# Patient Record
Sex: Female | Born: 1960 | Race: Black or African American | Hispanic: No | Smoking: Former smoker
Health system: Southern US, Community
[De-identification: ages and names within clinical notes are randomized; demographics above are authoritative.]

## PROBLEM LIST (undated history)

## (undated) DIAGNOSIS — I10 Essential (primary) hypertension: Secondary | ICD-10-CM

## (undated) DIAGNOSIS — M792 Neuralgia and neuritis, unspecified: Secondary | ICD-10-CM

## (undated) DIAGNOSIS — E119 Type 2 diabetes mellitus without complications: Secondary | ICD-10-CM

## (undated) DIAGNOSIS — F419 Anxiety disorder, unspecified: Secondary | ICD-10-CM

## (undated) DIAGNOSIS — M1990 Unspecified osteoarthritis, unspecified site: Secondary | ICD-10-CM

## (undated) DIAGNOSIS — E7800 Pure hypercholesterolemia, unspecified: Secondary | ICD-10-CM

## (undated) DIAGNOSIS — E079 Disorder of thyroid, unspecified: Secondary | ICD-10-CM

## (undated) DIAGNOSIS — G4730 Sleep apnea, unspecified: Secondary | ICD-10-CM

## (undated) DIAGNOSIS — G8929 Other chronic pain: Secondary | ICD-10-CM

## (undated) DIAGNOSIS — K219 Gastro-esophageal reflux disease without esophagitis: Secondary | ICD-10-CM

## (undated) DIAGNOSIS — M25569 Pain in unspecified knee: Secondary | ICD-10-CM

## (undated) HISTORY — PX: CHOLECYSTECTOMY: SHX55

## (undated) HISTORY — PX: JOINT REPLACEMENT: SHX530

## (undated) HISTORY — DX: Anxiety disorder, unspecified: F41.9

## (undated) HISTORY — PX: BREAST SURGERY: SHX581

## (undated) HISTORY — PX: ABDOMINAL HYSTERECTOMY: SHX81

## (undated) HISTORY — PX: CARPAL TUNNEL RELEASE: SHX101

---

## 2007-03-13 ENCOUNTER — Encounter: Payer: Self-pay | Admitting: Pulmonary Disease

## 2007-04-09 ENCOUNTER — Encounter: Payer: Self-pay | Admitting: Pulmonary Disease

## 2010-06-23 DIAGNOSIS — M129 Arthropathy, unspecified: Secondary | ICD-10-CM | POA: Insufficient documentation

## 2010-06-23 DIAGNOSIS — E785 Hyperlipidemia, unspecified: Secondary | ICD-10-CM | POA: Insufficient documentation

## 2010-06-23 DIAGNOSIS — I10 Essential (primary) hypertension: Secondary | ICD-10-CM | POA: Insufficient documentation

## 2010-07-10 ENCOUNTER — Ambulatory Visit: Payer: Self-pay | Admitting: Pulmonary Disease

## 2010-07-10 DIAGNOSIS — G4733 Obstructive sleep apnea (adult) (pediatric): Secondary | ICD-10-CM | POA: Insufficient documentation

## 2010-07-10 DIAGNOSIS — G2581 Restless legs syndrome: Secondary | ICD-10-CM | POA: Insufficient documentation

## 2010-08-18 ENCOUNTER — Encounter: Payer: Self-pay | Admitting: Pulmonary Disease

## 2010-12-22 NOTE — Medication Information (Signed)
Summary: [facility]   Imported By: Lester Houghton 08/26/2010 07:53:42  _____________________________________________________________________  External Attachment:    Type:   Image     Comment:   External Document

## 2010-12-22 NOTE — Assessment & Plan Note (Signed)
Summary: consult for osa, RLS   Copy to:  Amada Kingfisher Bonsu Primary Provider/Referring Provider:  Amada Kingfisher Bonsu  CC:  Sleep Consult.  History of Present Illness: The pt is a 49y/o female who is referred for management of osa.  She was diagnosed in 2008 with mild osa (AHI 11/hr), and large numbers of leg jerks with arousal index of 4/hr.  She was started on cpap, but discontinued its use because she felt it was not helping her.  She also had issues with having something on her face during the night.  The pt was also started on  carbidopa/levadopa for her her PLMS.  She currently does have some breakthru RLS symptoms, but it is much worse if she doesn't take her medication.  The pt has been noted to have snoring, but no one has noted pauses in her breathing during sleep.  She typically goes to bed around 11pm, and requires a sleep aid to initiate sleep.  She awakens at 5am to start her day, and is somewhat tired but "feels ok".  She denies sleepiness or fogginess during the day with periods of inactivity, and her epworth score today is very normal at zero.  She does feel however, that her overall alertness could be better.  She denies any sleepiness in the evening watching tv or movies, and has no sleepiness issues with driving.  She tells me that her weight is up 15 pounds from 2007.    Current Medications (verified): 1)  Zolpidem Tartrate 10 Mg Tabs (Zolpidem Tartrate) .... At Bedtime 2)  Phentermine Hcl 37.5 Mg Tabs (Phentermine Hcl) .... Take 1 Tablet By Mouth Two Times A Day 3)  Hyzaar 100-25 Mg Tabs (Losartan Potassium-Hctz) .... Take 1 Tablet By Mouth Once A Day 4)  Mobic 15 Mg Tabs (Meloxicam) .... Take 1 Tablet By Mouth Once A Day 5)  Carbidopa-Levodopa 25-100 Mg Tabs (Carbidopa-Levodopa) .... Take 2 Tabs By Mouth At Bedtime 6)  Simvastatin 80 Mg Tabs (Simvastatin) .... Take 1 Tablet By Mouth Once A Day 7)  Klor-Con 10 10 Meq Cr-Tabs (Potassium Chloride) .... Take 1 Tablet By Mouth Once  A Day 8)  Zinc 50 Mg Tabs (Zinc) .... Take 1 Tablet By Mouth Once A Day 9)  Magnesium 250 Mg Tabs (Magnesium) .... Take 1 Tablet By Mouth Once A Day 10)  Soy Isoflavones 40 Mg Tabs (Soy Isoflavone) .... Take 2 Tabs By Mouth Daily 11)  Vitamin D3 2000 Unit Caps (Cholecalciferol) .... Take 1 Tablet By Mouth Once A Day 12)  Biotin 5000 5 Mg Caps (Biotin) .... Take 1 Tablet By Mouth Once A Day 13)  Fish Oil 1000 Mg Caps (Omega-3 Fatty Acids) .... Take 2 Tabs By Mouth Daily 14)  Vitamin B-12 250 Mcg Tabs (Cyanocobalamin) .... Take 1 Tablet By Mouth Once A Day 15)  Stool Softener 100 Mg Caps (Docusate Sodium) .... Take 3 Tabs By Mouth Daily 16)  Sam-E 400 Mg Tabs (S-Adenosylmethionine) .... Take 1 Tablet By Mouth Once A Day 17)  Ra Suphedrine 120 Mg Xr12h-Tab (Pseudoephedrine Hcl) .... As Needed  Allergies (verified): No Known Drug Allergies  Past History:  Past Medical History: RLS ARTHRITIS (ICD-716.90) OBSTRUCTIVE SLEEP APNEA (ICD-327.23)--AHI 11/hr 2008 HYPERTENSION (ICD-401.9) HYPERLIPIDEMIA (ICD-272.4)    Past Surgical History: hysterectomy Nov 2001 Tubal Ligation Sept 1988 Cholecystectomy 2002 bilateral carpal tunnel April 2008  Family History: Reviewed history from 06/23/2010 and no changes required. father-epilepsy mother-decease, HTN  Social History: Reviewed history from 06/23/2010 and no changes  required. Patient is a current smoker.   started at age 72.  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  The patient denies shortness of breath with activity, shortness of breath at rest, productive cough, non-productive cough, coughing up blood, chest pain, irregular heartbeats, indigestion, loss of appetite,  weight change, difficulty swallowing, sore throat, tooth/dental problems, sneezing, itching, ear ache, anxiety, depression, hand/feet swelling, rash, change in color of mucus, and fever.    Vital Signs:  Patient profile:   50 year old female Height:      63 inches Weight:      177.50 pounds BMI:     31.56 O2 Sat:      97 % on Room air Temp:     98.5 degrees F oral Pulse rate:   87 / minute BP sitting:   114 / 78  (right arm) Cuff size:   regular  Vitals Entered By: Arman Filter LPN (July 10, 2010 2:15 PM)  O2 Flow:  Room air CC: Sleep Consult Comments Medications reviewed with patient Arman Filter LPN  July 10, 2010 2:21 PM    Physical Exam  General:  ow female in nad Eyes:  PERRLA and EOMI.   Nose:  turbinate hypertrophy, mild narrowing bilat Mouth:  fairly normal uvula and palate. Neck:  no jvd ,tmg, LN Lungs:  clear to auscultation Heart:  rrr, no mrg. Abdomen:  soft and nontender, bs+ Extremities:  no significant edema, pulses intact distally no cyanosis  Neurologic:  alert and oriented, moves all 4.    Impression & Recommendations:  Problem # 1:  OBSTRUCTIVE SLEEP APNEA (ICD-327.23)  the pt has a h/o very mild osa, and currently is not on treatment.  She is not excessively sleepy (epworth score 0, and denies sleepiness with inactivity), and this degree of sleep apnea is not a health risk for her.  I think it is best at this time to work on weight loss and positional therapy, and to stay off cpap for now.  She had a lot of issues with cpap, and it worsened her sleep onset issues.  If she feels that sleepiness is impacting her QOL adversely, we can always re-initiate cpap vs looking at other treatment options.  Problem # 2:  RESTLESS LEGS SYNDROME (ICD-333.94)  the pt is on a very old medication for RLS that is usually not well tolerated due to nausea.  The pt is having some issues with this.  I would like to try her on requip to see if this helps with nausea,  and if her leg jerks are better controlled.  Medications Added to Medication List This Visit: 1)  Phentermine Hcl 37.5 Mg Tabs (Phentermine hcl) .... Take 1 tablet by mouth two times a day 2)  Hyzaar 100-25 Mg Tabs (Losartan potassium-hctz) .... Take 1 tablet by mouth once a day 3)  Carbidopa-levodopa 25-100 Mg Tabs (Carbidopa-levodopa) .... Take 2 tabs by mouth at bedtime 4)  Simvastatin 80 Mg Tabs (Simvastatin) .... Take 1 tablet by mouth once a day 5)  Klor-con 10 10 Meq Cr-tabs (Potassium chloride) .... Take 1 tablet by mouth once a day 6)  Zinc 50 Mg Tabs (Zinc) .... Take 1 tablet by mouth once a day 7)  Magnesium 250 Mg Tabs (Magnesium) .... Take 1 tablet by mouth once a day 8)  Soy Isoflavones 40 Mg Tabs (Soy isoflavone) .... Take 2 tabs by mouth daily 9)  Vitamin D3 2000 Unit Caps (Cholecalciferol) .... Take 1 tablet by mouth once a day 10)  Biotin 5000 5 Mg Caps (Biotin) .... Take 1 tablet by mouth once a day 11)  Fish Oil 1000 Mg Caps (Omega-3 fatty acids) .... Take 2 tabs by mouth daily 12)  Vitamin B-12 250 Mcg Tabs (Cyanocobalamin) .... Take 1 tablet by mouth once a day 13)  Stool Softener 100 Mg Caps (Docusate sodium) .... Take 3 tabs by mouth daily 14)  Sam-e 400 Mg Tabs (S-adenosylmethionine) .... Take 1 tablet by mouth once a day 15)  Ra Suphedrine 120 Mg Xr12h-tab (Pseudoephedrine hcl) .... As needed 16)  Requip 0.5 Mg Tabs (Ropinirole hcl) .... One after dinner each night.  can increase to 2 if having breakthru symptoms.  Other Orders: Consultation Level IV (41324)  Patient Instructions: 1)  change your carbidopa to requip 0.5mg  one after dinner on a full stomach.  Can increase to 2 after dinner if leg movements not completely controlled. 2)  will hold off on starting cpap for now.  Work on weight loss and avoid sleeping on your back 3)  followup with me in 6mos to check on your progress.  Prescriptions: REQUIP 0.5 MG  TABS (ROPINIROLE HCL) one after dinner each night.   Can increase to 2 if having breakthru symptoms.  #60 x 5   Entered and Authorized by:   Barbaraann Share MD   Signed by:   Barbaraann Share MD on 07/10/2010   Method used:   Print then Give to Patient   RxID:   [id number]

## 2011-01-06 ENCOUNTER — Ambulatory Visit: Payer: Self-pay | Admitting: Pulmonary Disease

## 2011-01-07 ENCOUNTER — Telehealth: Payer: Self-pay | Admitting: Pulmonary Disease

## 2011-01-13 NOTE — Progress Notes (Signed)
Summary: nos appt  Phone Note Call from Patient   Caller: [email or web address]  Call For: Trista Ciocca Summary of Call: ATC pt to rsc nos from 2/15 no vm. Initial call taken by: Darletta Moll,  January 07, 2011 10:49 AM

## 2012-06-16 DIAGNOSIS — R590 Localized enlarged lymph nodes: Secondary | ICD-10-CM | POA: Insufficient documentation

## 2012-06-25 DIAGNOSIS — R918 Other nonspecific abnormal finding of lung field: Secondary | ICD-10-CM | POA: Insufficient documentation

## 2012-12-20 DIAGNOSIS — D869 Sarcoidosis, unspecified: Secondary | ICD-10-CM | POA: Insufficient documentation

## 2014-05-28 DIAGNOSIS — K219 Gastro-esophageal reflux disease without esophagitis: Secondary | ICD-10-CM | POA: Insufficient documentation

## 2014-05-28 DIAGNOSIS — Z0000 Encounter for general adult medical examination without abnormal findings: Secondary | ICD-10-CM | POA: Insufficient documentation

## 2014-05-28 DIAGNOSIS — E559 Vitamin D deficiency, unspecified: Secondary | ICD-10-CM | POA: Insufficient documentation

## 2014-05-28 DIAGNOSIS — G4700 Insomnia, unspecified: Secondary | ICD-10-CM | POA: Insufficient documentation

## 2014-05-28 DIAGNOSIS — E039 Hypothyroidism, unspecified: Secondary | ICD-10-CM | POA: Insufficient documentation

## 2014-10-08 DIAGNOSIS — N760 Acute vaginitis: Secondary | ICD-10-CM | POA: Insufficient documentation

## 2014-12-24 DIAGNOSIS — M1712 Unilateral primary osteoarthritis, left knee: Secondary | ICD-10-CM | POA: Insufficient documentation

## 2015-01-23 DIAGNOSIS — R2233 Localized swelling, mass and lump, upper limb, bilateral: Secondary | ICD-10-CM | POA: Insufficient documentation

## 2015-02-04 DIAGNOSIS — N6019 Diffuse cystic mastopathy of unspecified breast: Secondary | ICD-10-CM | POA: Insufficient documentation

## 2015-02-04 DIAGNOSIS — J302 Other seasonal allergic rhinitis: Secondary | ICD-10-CM | POA: Insufficient documentation

## 2015-02-04 DIAGNOSIS — N644 Mastodynia: Secondary | ICD-10-CM | POA: Insufficient documentation

## 2015-02-04 DIAGNOSIS — R0602 Shortness of breath: Secondary | ICD-10-CM | POA: Insufficient documentation

## 2015-04-30 DIAGNOSIS — Z780 Asymptomatic menopausal state: Secondary | ICD-10-CM | POA: Insufficient documentation

## 2015-07-23 LAB — HM PAP SMEAR: HM Pap smear: NEGATIVE

## 2015-07-30 DIAGNOSIS — M19041 Primary osteoarthritis, right hand: Secondary | ICD-10-CM | POA: Insufficient documentation

## 2015-08-20 DIAGNOSIS — R0789 Other chest pain: Secondary | ICD-10-CM | POA: Insufficient documentation

## 2015-11-23 HISTORY — PX: CARDIOVASCULAR STRESS TEST: SHX262

## 2016-01-14 DIAGNOSIS — M170 Bilateral primary osteoarthritis of knee: Secondary | ICD-10-CM | POA: Insufficient documentation

## 2016-03-15 DIAGNOSIS — M792 Neuralgia and neuritis, unspecified: Secondary | ICD-10-CM | POA: Insufficient documentation

## 2016-03-19 DIAGNOSIS — M5416 Radiculopathy, lumbar region: Secondary | ICD-10-CM | POA: Insufficient documentation

## 2016-04-01 DIAGNOSIS — G5730 Lesion of lateral popliteal nerve, unspecified lower limb: Secondary | ICD-10-CM | POA: Insufficient documentation

## 2016-06-29 DIAGNOSIS — R600 Localized edema: Secondary | ICD-10-CM | POA: Insufficient documentation

## 2016-06-29 DIAGNOSIS — Z860100 Personal history of colon polyps, unspecified: Secondary | ICD-10-CM | POA: Insufficient documentation

## 2016-06-29 LAB — HM HEPATITIS C SCREENING LAB: HM Hepatitis Screen: NEGATIVE

## 2016-08-24 DIAGNOSIS — M722 Plantar fascial fibromatosis: Secondary | ICD-10-CM | POA: Insufficient documentation

## 2016-12-13 DIAGNOSIS — R5383 Other fatigue: Secondary | ICD-10-CM | POA: Insufficient documentation

## 2017-03-23 ENCOUNTER — Emergency Department (HOSPITAL_BASED_OUTPATIENT_CLINIC_OR_DEPARTMENT_OTHER): Payer: BLUE CROSS/BLUE SHIELD

## 2017-03-23 ENCOUNTER — Encounter (HOSPITAL_BASED_OUTPATIENT_CLINIC_OR_DEPARTMENT_OTHER): Payer: Self-pay | Admitting: Emergency Medicine

## 2017-03-23 ENCOUNTER — Emergency Department (HOSPITAL_BASED_OUTPATIENT_CLINIC_OR_DEPARTMENT_OTHER)
Admission: EM | Admit: 2017-03-23 | Discharge: 2017-03-23 | Disposition: A | Discharge status: Home / Self Care | Payer: BLUE CROSS/BLUE SHIELD | Attending: Emergency Medicine | Admitting: Emergency Medicine

## 2017-03-23 DIAGNOSIS — Z87891 Personal history of nicotine dependence: Secondary | ICD-10-CM | POA: Diagnosis not present

## 2017-03-23 DIAGNOSIS — R110 Nausea: Secondary | ICD-10-CM | POA: Diagnosis not present

## 2017-03-23 DIAGNOSIS — I10 Essential (primary) hypertension: Secondary | ICD-10-CM | POA: Diagnosis not present

## 2017-03-23 DIAGNOSIS — Z79899 Other long term (current) drug therapy: Secondary | ICD-10-CM | POA: Insufficient documentation

## 2017-03-23 DIAGNOSIS — R072 Precordial pain: Secondary | ICD-10-CM | POA: Insufficient documentation

## 2017-03-23 DIAGNOSIS — R0602 Shortness of breath: Secondary | ICD-10-CM | POA: Diagnosis not present

## 2017-03-23 HISTORY — DX: Pure hypercholesterolemia, unspecified: E78.00

## 2017-03-23 HISTORY — DX: Pain in unspecified knee: M25.569

## 2017-03-23 HISTORY — DX: Essential (primary) hypertension: I10

## 2017-03-23 HISTORY — DX: Neuralgia and neuritis, unspecified: M79.2

## 2017-03-23 HISTORY — DX: Disorder of thyroid, unspecified: E07.9

## 2017-03-23 HISTORY — DX: Gastro-esophageal reflux disease without esophagitis: K21.9

## 2017-03-23 HISTORY — DX: Other chronic pain: G89.29

## 2017-03-23 LAB — URINALYSIS, ROUTINE W REFLEX MICROSCOPIC
Bilirubin Urine: NEGATIVE
Glucose, UA: NEGATIVE mg/dL
Hgb urine dipstick: NEGATIVE
Ketones, ur: NEGATIVE mg/dL
Leukocytes, UA: NEGATIVE
Nitrite: NEGATIVE
Protein, ur: NEGATIVE mg/dL
Specific Gravity, Urine: 1.017 (ref 1.005–1.030)
pH: 6.5 (ref 5.0–8.0)

## 2017-03-23 LAB — CBC
HCT: 44.4 % (ref 36.0–46.0)
Hemoglobin: 14.6 g/dL (ref 12.0–15.0)
MCH: 29.5 pg (ref 26.0–34.0)
MCHC: 32.9 g/dL (ref 30.0–36.0)
MCV: 89.7 fL (ref 78.0–100.0)
Platelets: 311 10*3/uL (ref 150–400)
RBC: 4.95 MIL/uL (ref 3.87–5.11)
RDW: 15.2 % (ref 11.5–15.5)
WBC: 5.1 10*3/uL (ref 4.0–10.5)

## 2017-03-23 LAB — I-STAT CHEM 8, ED
BUN: 12 mg/dL (ref 6–20)
Calcium, Ion: 1.14 mmol/L — ABNORMAL LOW (ref 1.15–1.40)
Chloride: 100 mmol/L — ABNORMAL LOW (ref 101–111)
Creatinine, Ser: 1.2 mg/dL — ABNORMAL HIGH (ref 0.44–1.00)
Glucose, Bld: 107 mg/dL — ABNORMAL HIGH (ref 65–99)
HCT: 47 % — ABNORMAL HIGH (ref 36.0–46.0)
Hemoglobin: 16 g/dL — ABNORMAL HIGH (ref 12.0–15.0)
Potassium: 3.1 mmol/L — ABNORMAL LOW (ref 3.5–5.1)
Sodium: 142 mmol/L (ref 135–145)
TCO2: 30 mmol/L (ref 0–100)

## 2017-03-23 LAB — TROPONIN I
Troponin I: <0.03 ng/mL (ref <0.03)
Troponin I: <0.03 ng/mL (ref <0.03)

## 2017-03-23 LAB — PROTIME-INR
INR: 0.99
Prothrombin Time: 13.1 seconds (ref 11.4–15.2)

## 2017-03-23 LAB — D-DIMER, QUANTITATIVE: D-Dimer, Quant: 1.47 ug/mL-FEU — ABNORMAL HIGH (ref 0.00–0.50)

## 2017-03-23 MED ORDER — IOPAMIDOL (ISOVUE-370) INJECTION 76%
100.0000 mL | Freq: Once | INTRAVENOUS | Status: AC | PRN
  Administered 2017-03-23: 100 mL via INTRAVENOUS

## 2017-03-23 NOTE — ED Triage Notes (Signed)
Centralized chest pain, sudden onset followed by burning sensation in chest up to throat.  Pt states she had some nausea.  No sob noted.  No diaphoresis.

## 2017-03-23 NOTE — Discharge Instructions (Signed)
Please schedule an appointment to follow-up with your primary care physician in the next several days. Please stay hydrated and when you see her PCP, have them check your kidney function. If symptoms change or worsen, please return to the nearest emergency department.

## 2017-03-23 NOTE — ED Provider Notes (Signed)
[facility]   CSN: [id number] Arrival date & time: 03/23/17  1151     History   Chief Complaint Chief Complaint  Patient presents with  . Chest Pain    HPI Dorothy Williamson is a 56 y.o. female.  The history is provided by the patient.  Chest Pain   This is a new problem. The current episode started 1 to 2 hours ago. The problem occurs constantly. The problem has been gradually improving. Associated with: nothging. The pain is present in the substernal region and lateral region. The pain is at a severity of 8/10. The pain is moderate. The quality of the pain is described as vice-like and pressure-like. The pain does not radiate. Duration of episode(s) is 2 hours. The symptoms are aggravated by certain positions. Associated symptoms include nausea and shortness of breath. Pertinent negatives include no abdominal pain, no back pain, no cough, no diaphoresis, no dizziness, no exertional chest pressure, no fever, no headaches, no hemoptysis, no lower extremity edema, no malaise/fatigue, no near-syncope, no numbness, no palpitations, no sputum production, no syncope, no vomiting and no weakness. She has tried nothing for the symptoms. The treatment provided no relief.  Her past medical history is significant for hyperlipidemia and hypertension.    Past Medical History:  Diagnosis Date  . Chronic knee pain   . GERD (gastroesophageal reflux disease)   . Hypercholesteremia   . Hypertension   . Nerve pain   . Thyroid disease     Patient Active Problem List   Diagnosis Date Noted  . OBSTRUCTIVE SLEEP APNEA 07/10/2010  . RESTLESS LEGS SYNDROME 07/10/2010  . HYPERLIPIDEMIA 06/23/2010  . HYPERTENSION 06/23/2010  . ARTHRITIS 06/23/2010    Past Surgical History:  Procedure Laterality Date  . ABDOMINAL HYSTERECTOMY    . BREAST SURGERY    . CARDIOVASCULAR STRESS TEST Bilateral 2017   normal results  . CARPAL TUNNEL RELEASE    . CHOLECYSTECTOMY    . JOINT  REPLACEMENT      OB History    No data available       Home Medications    Prior to Admission medications   Medication Sig Start Date End Date Taking? Authorizing Provider  amLODipine (NORVASC) 5 MG tablet Take 5 mg by mouth daily.   Yes Historical Provider, MD  carbidopa-levodopa (SINEMET IR) 25-100 MG tablet Take 1 tablet by mouth 3 (three) times daily.   Yes Historical Provider, MD  DULoxetine (CYMBALTA) 30 MG capsule Take 90 mg by mouth daily.   Yes Historical Provider, MD  etodolac (LODINE) 400 MG tablet Take 400 mg by mouth 2 (two) times daily.   Yes Historical Provider, MD  gabapentin (NEURONTIN) 300 MG capsule Take 300 mg by mouth 3 (three) times daily.   Yes Historical Provider, MD  hydrochlorothiazide (HYDRODIURIL) 12.5 MG tablet Take 12.5 mg by mouth daily.   Yes Historical Provider, MD  levothyroxine (SYNTHROID, LEVOTHROID) 137 MCG tablet Take 137 mcg by mouth daily before breakfast.   Yes Historical Provider, MD  losartan-hydrochlorothiazide (HYZAAR) 100-12.5 MG tablet Take 1 tablet by mouth daily.   Yes Historical Provider, MD  pantoprazole (PROTONIX) 40 MG tablet Take 40 mg by mouth daily.   Yes Historical Provider, MD  potassium chloride (K-DUR) 10 MEQ tablet Take 10 mEq by mouth daily.   Yes Historical Provider, MD  simvastatin (ZOCOR) 10 MG tablet Take 10 mg by mouth daily.   Yes Historical Provider, MD  zolpidem (AMBIEN) 10 MG tablet Take  10 mg by mouth at bedtime as needed for sleep.   Yes Historical Provider, MD    Family History No family history on file.  Social History Social History  Substance Use Topics  . Smoking status: Former Games developer  . Smokeless tobacco: Never Used  . Alcohol use Not on file     Allergies   Patient has no known allergies.   Review of Systems Review of Systems  Constitutional: Negative for chills, diaphoresis, fatigue, fever and malaise/fatigue.  HENT: Negative for congestion and rhinorrhea.   Respiratory: Positive for chest  tightness and shortness of breath. Negative for cough, hemoptysis, sputum production, choking, wheezing and stridor.   Cardiovascular: Positive for chest pain. Negative for palpitations, leg swelling, syncope and near-syncope.  Gastrointestinal: Positive for nausea. Negative for abdominal pain, constipation, diarrhea and vomiting.  Genitourinary: Negative for dysuria.  Musculoskeletal: Negative for back pain, neck pain and neck stiffness.  Skin: Negative for rash and wound.  Neurological: Negative for dizziness, weakness, light-headedness, numbness and headaches.  Psychiatric/Behavioral: Negative for agitation and confusion.  All other systems reviewed and are negative.    Physical Exam Updated Vital Signs BP (!) 143/90 (BP Location: Right Arm)   Pulse 81   Temp 98.7 F (37.1 C) (Oral)   Resp 15   Ht  (1.6 m)   Wt 210 lb (95.3 kg)   SpO2 97%   BMI 37.20 kg/m   Physical Exam  Constitutional: She is oriented to person, place, and time. She appears well-developed and well-nourished. No distress.  HENT:  Head: Normocephalic and atraumatic.  Mouth/Throat: Oropharynx is clear and moist. No oropharyngeal exudate.  Eyes: Conjunctivae and EOM are normal. Pupils are equal, round, and reactive to light.  Neck: Normal range of motion. Neck supple.  Cardiovascular: Normal rate, regular rhythm, normal heart sounds and intact distal pulses.   No murmur heard. Pulmonary/Chest: Effort normal and breath sounds normal. No stridor. No respiratory distress. She has no wheezes. She has no rales. She exhibits no tenderness.  Abdominal: Soft. There is no tenderness.  Musculoskeletal: She exhibits no edema or tenderness.  Lymphadenopathy:    She has no cervical adenopathy.  Neurological: She is alert and oriented to person, place, and time. No sensory deficit. She exhibits normal muscle tone. Coordination normal.  Skin: Skin is warm and dry. Capillary refill takes less than 2 seconds. No rash  noted. She is not diaphoretic.  Psychiatric: She has a normal mood and affect. Her behavior is normal.  Nursing note and vitals reviewed.    ED Treatments / Results  Labs (all labs ordered are listed, but only abnormal results are displayed) Labs Reviewed  D-DIMER, QUANTITATIVE (NOT AT [facility]) - Abnormal; Notable for the following:       Result Value   D-Dimer, Quant 1.47 (*)    All other components within normal limits  I-STAT CHEM 8, ED - Abnormal; Notable for the following:    Potassium 3.1 (*)    Chloride 100 (*)    Creatinine, Ser 1.20 (*)    Glucose, Bld 107 (*)    Calcium, Ion 1.14 (*)    Hemoglobin 16.0 (*)    HCT 47.0 (*)    All other components within normal limits  CBC  PROTIME-INR  URINALYSIS, ROUTINE W REFLEX MICROSCOPIC  TROPONIN I  TROPONIN I    EKG  EKG Interpretation  Date/Time:  Wednesday Mar 23 2017 12:01:38 EDT Ventricular Rate:  84 PR Interval:  128 QRS Duration: 80 QT  Interval:  410 QTC Calculation: 484 R Axis:   20 Text Interpretation:  Normal sinus rhythm Nonspecific T wave abnormality Abnormal ECG No prior ECG for comparison.  No STEMI Confirmed by Rush Landmark MD, Trason Shifflet [phone]) on 03/23/2017 12:17:53 PM       Radiology Dg Chest 2 View  Result Date: 03/23/2017 CLINICAL DATA:  Chest pain EXAM: CHEST  2 VIEW COMPARISON:  None. FINDINGS: Low volume chest with bronchitic type interstitial coarsening. Nodular opacity over the right upper lobe measuring 13 mm. Normal heart size and mediastinal contours. No acute osseous finding. Cholecystectomy clips. IMPRESSION: 1. 13 mm nodule over the right lung.  Recommend chest CT. 2. Bronchitic markings. Electronically Signed   By: Marnee Spring M.D.   On: 03/23/2017 13:03   Ct Angio Chest Pe W And/or Wo Contrast  Result Date: 03/23/2017 CLINICAL DATA:  Patient c/o centralized chest pain, sudden onset followed by burning sensation in chest up to throat. Pt states she had some nausea, positive d-dimer, hx of  HTN, no other complaints EXAM: CT ANGIOGRAPHY CHEST WITH CONTRAST TECHNIQUE: Multidetector CT imaging of the chest was performed using the standard protocol during bolus administration of intravenous contrast. Multiplanar CT image reconstructions and MIPs were obtained to evaluate the vascular anatomy. CONTRAST:  100 mL of Isovue 370 intravenous contrast COMPARISON:  Current chest radiograph. FINDINGS: Cardiovascular: Satisfactory opacification of the pulmonary arteries to the segmental level. No evidence of pulmonary embolism. Heart top-normal in size. No coronary artery calcifications. Great vessels normal in caliber. No aortic dissection or atherosclerosis. Mediastinum/Nodes: There are prominent and mildly enlarged partly calcified mediastinal and hilar lymph nodes. Largest mediastinal nodes are an azygos level right para carinal node measuring 16 mm in short axis and a right subcarinal node measuring 2 cm in short axis. Bilateral partly calcified hilar nodes noted. Largest on the left measures 15 mm in short axis. Largest on the right measures 18 mm in short axis. The trachea and esophagus are unremarkable. Lungs/Pleura: There is upper lobe peribronchovascular opacity, seen mostly on the right with predominates posterior right upper lobe. There are areas of more confluent nodular type opacity, the largest centered on image 26, series 5, measuring 15 x 11 x 13 mm in size. On the left, there is irregular pleural based nodule at the apex measuring 11 x 8 mm. The left upper lobe lingula, right middle lobe and lower lobes are essentially clear. No pleural effusion. No pneumothorax. Upper Abdomen: Partly calcified lymph node lies adjacent to the distal esophagus just above the esophageal hiatus. There are no acute findings in the upper abdomen. Musculoskeletal: No fracture or acute finding. No osteoblastic or osteolytic lesions. Review of the MIP images confirms the above findings. IMPRESSION: 1. No evidence of a  pulmonary embolism. 2. No acute findings. 3. Nodule projecting in the right upper lobe on the current chest radiograph is due to an area of focal opacity in the right upper lobe, which measures 15 x 11 x 13 mm. There other irregular nodules as well as peribronchovascular opacity in both upper lobes, greater on the right. This is associated with prominent and enlarged, partly calcified, mediastinal and hilar lymph nodes. 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 Since there are no prior studies to document the stability of these findings, follow-up is recommended. Non-contrast chest CT at 3-6 months is recommended. If the nodules are stable at time of repeat CT, then future CT at 18-24 months (from today's  scan) is considered optional for low-risk patients, but is recommended for high-risk patients. This recommendation follows the consensus statement: Guidelines for Management of Incidental Pulmonary Nodules Detected on CT Images: From the Fleischner Society 2017; Radiology 2017; 284:228-243. Electronically Signed   By: Amie Portland M.D.   On: 03/23/2017 15:09    Procedures Procedures (including critical care time)  Medications Ordered in ED Medications  iopamidol (ISOVUE-370) 76 % injection 100 mL (100 mLs Intravenous Contrast Given 03/23/17 1445)     Initial Impression / Assessment and Plan / ED Course  I have reviewed the triage vital signs and the nursing notes.  Pertinent labs & imaging results that were available during my care of the patient were reviewed by me and considered in my medical decision making (see chart for details).     Ameli Sangiovanni is a 56 y.o. female with a past medical history significant for hypertension, hypercholesterolemia, GERD, and sarcoidosis who presents with chest pain and shortness of breath. Patient reports that her symptoms began about 11 AM and was sudden in nature.   Patient describes a  tightness across her chest that she says is moderate to severe in severity. She describes that is not pleuritic and is not exertional. It is sometimes worsened with positions. She denies vomiting but reports mild nausea. She denies lightheadedness, syncope. He denies any recent trauma. She denies any history of this, fevers, chills, congestion, rhinorrhea. She denies any abdominal pain, constipation, diarrhea, or dysuria. She has no other complaints. Symptoms are improving upon arrival.  History and exam are seen above. On exam, patient has clear breath sounds. Chest is nontender to palpation. Abdomen is nontender. No focal neurologic deficits.  Based on patient symptoms, patient will have workup to look for cardiac, pulmonary, or muscular skeletal etiology of symptoms.  Initial EKG did not show STEMI. No prior EKG for comparison.  Troponin negative times two. D dimer elevated. Chest x-ray showed nodule in the lung. No pneumonia.  Due to the nodule and elevated dimer, CT PE study was ordered. CT scan revealed a likely old granulomatous disease, possibly her sarcoid, as the nodule. No evidence of pulmonary embolism.  During her stay, patient's chest pain had nearly resolved. Patient feels much better. Due to reassuring workup, suspect a musculoskeletal type chest pain. Patient was felt stable for discharge home with implicit instructions to follow up with her PCP in the next 24 to 48 hours as well as strict return precautions for any new or continued symptoms.   Patient voiced understanding of the plan of care. Patient had no other questions or concerns, and patient was discharged in good condition.       Final Clinical Impressions(s) / ED Diagnoses   Final diagnoses:  Precordial pain  Shortness of breath    New Prescriptions Discharge Medication List as of 03/23/2017  4:40 PM     Clinical Impression: 1. Precordial pain   2. Shortness of breath     Disposition:  Discharge  Condition: Good  I have discussed the results, Dx and Tx plan with the pt(& family if present). He/she/they expressed understanding and agree(s) with the plan. Discharge instructions discussed at great length. Strict return precautions discussed and pt &/or family have verbalized understanding of the instructions. No further questions at time of discharge.    Discharge Medication List as of 03/23/2017  4:40 PM      Follow Up: Angelica Chessman, MD [address] [place] [address] [phone]  Schedule an appointment as soon as possible for a visit    [facility] [place] [facility] [address] [id number] mc [address] [place] [address] [phone]  If symptoms worsen     Heide Scales, MD 03/24/17 [phone]

## 2017-03-23 NOTE — ED Notes (Signed)
Patient transported to CT 

## 2017-03-28 DIAGNOSIS — R9389 Abnormal findings on diagnostic imaging of other specified body structures: Secondary | ICD-10-CM | POA: Insufficient documentation

## 2017-03-28 DIAGNOSIS — N289 Disorder of kidney and ureter, unspecified: Secondary | ICD-10-CM | POA: Insufficient documentation

## 2017-09-27 DIAGNOSIS — T8484XA Pain due to internal orthopedic prosthetic devices, implants and grafts, initial encounter: Secondary | ICD-10-CM | POA: Insufficient documentation

## 2017-12-27 ENCOUNTER — Other Ambulatory Visit: Payer: Self-pay

## 2017-12-27 ENCOUNTER — Emergency Department (HOSPITAL_BASED_OUTPATIENT_CLINIC_OR_DEPARTMENT_OTHER): Payer: BLUE CROSS/BLUE SHIELD

## 2017-12-27 ENCOUNTER — Encounter (HOSPITAL_BASED_OUTPATIENT_CLINIC_OR_DEPARTMENT_OTHER): Payer: Self-pay

## 2017-12-27 ENCOUNTER — Emergency Department (HOSPITAL_BASED_OUTPATIENT_CLINIC_OR_DEPARTMENT_OTHER)
Admission: EM | Admit: 2017-12-27 | Discharge: 2017-12-28 | Disposition: A | Discharge status: Home / Self Care | Payer: BLUE CROSS/BLUE SHIELD | Attending: Emergency Medicine | Admitting: Emergency Medicine

## 2017-12-27 DIAGNOSIS — Z87891 Personal history of nicotine dependence: Secondary | ICD-10-CM | POA: Insufficient documentation

## 2017-12-27 DIAGNOSIS — R200 Anesthesia of skin: Secondary | ICD-10-CM | POA: Insufficient documentation

## 2017-12-27 DIAGNOSIS — Z79899 Other long term (current) drug therapy: Secondary | ICD-10-CM | POA: Diagnosis not present

## 2017-12-27 DIAGNOSIS — I10 Essential (primary) hypertension: Secondary | ICD-10-CM | POA: Diagnosis not present

## 2017-12-27 DIAGNOSIS — R519 Headache, unspecified: Secondary | ICD-10-CM

## 2017-12-27 DIAGNOSIS — R51 Headache: Secondary | ICD-10-CM | POA: Insufficient documentation

## 2017-12-27 LAB — BASIC METABOLIC PANEL
Anion gap: 10 (ref 5–15)
BUN: 16 mg/dL (ref 6–20)
CO2: 25 mmol/L (ref 22–32)
Calcium: 8.8 mg/dL — ABNORMAL LOW (ref 8.9–10.3)
Chloride: 100 mmol/L — ABNORMAL LOW (ref 101–111)
Creatinine, Ser: 0.77 mg/dL (ref 0.44–1.00)
GFR calc Af Amer: >60 mL/min (ref >60)
GFR calc non Af Amer: >60 mL/min (ref >60)
Glucose, Bld: 224 mg/dL — ABNORMAL HIGH (ref 65–99)
Potassium: 3.2 mmol/L — ABNORMAL LOW (ref 3.5–5.1)
Sodium: 135 mmol/L (ref 135–145)

## 2017-12-27 LAB — CBC
HCT: 45 % (ref 36.0–46.0)
Hemoglobin: 14.9 g/dL (ref 12.0–15.0)
MCH: 30.3 pg (ref 26.0–34.0)
MCHC: 33.1 g/dL (ref 30.0–36.0)
MCV: 91.5 fL (ref 78.0–100.0)
Platelets: 320 10*3/uL (ref 150–400)
RBC: 4.92 MIL/uL (ref 3.87–5.11)
RDW: 17.1 % — ABNORMAL HIGH (ref 11.5–15.5)
WBC: 8.7 10*3/uL (ref 4.0–10.5)

## 2017-12-27 LAB — TROPONIN I: Troponin I: <0.03 ng/mL (ref <0.03)

## 2017-12-27 MED ORDER — DIPHENHYDRAMINE HCL 50 MG/ML IJ SOLN
25.0000 mg | Freq: Once | INTRAMUSCULAR | Status: AC
  Administered 2017-12-27: 25 mg via INTRAVENOUS
  Filled 2017-12-27: qty 1

## 2017-12-27 MED ORDER — PROCHLORPERAZINE EDISYLATE 5 MG/ML IJ SOLN
10.0000 mg | Freq: Once | INTRAMUSCULAR | Status: AC
  Administered 2017-12-27: 10 mg via INTRAVENOUS
  Filled 2017-12-27: qty 2

## 2017-12-27 NOTE — ED Provider Notes (Signed)
[facility] EMERGENCY DEPARTMENT Provider Note   CSN: [id number] Arrival date & time: 12/27/17  1723     History   Chief Complaint Chief Complaint  Patient presents with  . Hypertension    HPI Dorothy Williamson is a 57 y.o. female.  The history is provided by the patient and medical records. No language interpreter was used.  Headache   This is a new problem. The current episode started more than 2 days ago (4 days). The problem occurs constantly. The problem has not changed since onset.The headache is associated with bright light (elevated BP). Pain location: all over. The quality of the pain is described as dull and throbbing. The pain is at a severity of 8/10. The pain is severe. The pain does not radiate. Associated symptoms include malaise/fatigue. Pertinent negatives include no fever, no chest pressure, no palpitations, no syncope, no shortness of breath, no nausea and no vomiting. She has tried nothing for the symptoms. The treatment provided no relief.  Neurologic Problem  This is a new problem. The current episode started 12 to 24 hours ago. The problem occurs constantly. The problem has not changed since onset.Associated symptoms include chest pain (resolved) and headaches. Pertinent negatives include no abdominal pain and no shortness of breath. Nothing aggravates the symptoms. Nothing relieves the symptoms. She has tried nothing for the symptoms. The treatment provided no relief.    Past Medical History:  Diagnosis Date  . Chronic knee pain   . GERD (gastroesophageal reflux disease)   . Hypercholesteremia   . Hypertension   . Nerve pain   . Thyroid disease     Patient Active Problem List   Diagnosis Date Noted  . OBSTRUCTIVE SLEEP APNEA 07/10/2010  . RESTLESS LEGS SYNDROME 07/10/2010  . HYPERLIPIDEMIA 06/23/2010  . HYPERTENSION 06/23/2010  . ARTHRITIS 06/23/2010    Past Surgical History:  Procedure Laterality Date  . ABDOMINAL HYSTERECTOMY    .  BREAST SURGERY    . CARDIOVASCULAR STRESS TEST Bilateral 2017   normal results  . CARPAL TUNNEL RELEASE    . CHOLECYSTECTOMY    . JOINT REPLACEMENT      OB History    No data available       Home Medications    Prior to Admission medications   Medication Sig Start Date End Date Taking? Authorizing Provider  amLODipine (NORVASC) 5 MG tablet Take 5 mg by mouth daily.    [provider]  carbidopa-levodopa (SINEMET IR) 25-100 MG tablet Take 1 tablet by mouth 3 (three) times daily.    [provider]  DULoxetine (CYMBALTA) 30 MG capsule Take 90 mg by mouth daily.    [provider]  etodolac (LODINE) 400 MG tablet Take 400 mg by mouth 2 (two) times daily.    [provider]  gabapentin (NEURONTIN) 300 MG capsule Take 300 mg by mouth 3 (three) times daily.    [provider]  hydrochlorothiazide (HYDRODIURIL) 12.5 MG tablet Take 12.5 mg by mouth daily.    [provider]  levothyroxine (SYNTHROID, LEVOTHROID) 137 MCG tablet Take 137 mcg by mouth daily before breakfast.    [provider]  simvastatin (ZOCOR) 10 MG tablet Take 10 mg by mouth daily.    [provider]  zolpidem (AMBIEN) 10 MG tablet Take 10 mg by mouth at bedtime as needed for sleep.    [provider]    Family History No family history on file.  Social History Social History  Tobacco Use  . Smoking status: Former Games developer  . Smokeless tobacco: Never Used  Substance Use Topics  . Alcohol use: No    Frequency: Never  . Drug use: No     Allergies   Patient has no known allergies.   Review of Systems Review of Systems  Constitutional: Positive for fatigue and malaise/fatigue. Negative for chills, diaphoresis and fever.  HENT: Negative for congestion.   Eyes: Positive for visual disturbance (blurry vision bilaterally resolved).  Respiratory: Positive for cough. Negative for chest tightness, shortness of breath and stridor.     Cardiovascular: Positive for chest pain (resolved). Negative for palpitations and syncope.  Gastrointestinal: Negative for abdominal pain, constipation, diarrhea, nausea and vomiting.  Genitourinary: Negative for dysuria.  Musculoskeletal: Negative for back pain, neck pain and neck stiffness.  Neurological: Positive for numbness and headaches. Negative for dizziness, facial asymmetry, weakness and light-headedness.  Hematological: Negative for adenopathy.  Psychiatric/Behavioral: Negative for agitation and confusion.  All other systems reviewed and are negative.    Physical Exam Updated Vital Signs BP (!) 171/88 (BP Location: Right Arm)   Pulse 86   Temp 98.1 F (36.7 C) (Oral)   Resp 18   Ht 5\' 4"  (1.626 m)   Wt 97.5 kg (215 lb)   SpO2 98%   BMI 36.90 kg/m   Physical Exam  Constitutional: She is oriented to person, place, and time. She appears well-developed and well-nourished. No distress.  HENT:  Head: Normocephalic and atraumatic.  Mouth/Throat: Oropharynx is clear and moist.  Eyes: Conjunctivae and EOM are normal. Pupils are equal, round, and reactive to light.  Neck: Normal range of motion.  Cardiovascular: Normal rate and intact distal pulses.  No murmur heard. Pulmonary/Chest: Effort normal and breath sounds normal. No stridor. No respiratory distress. She has no wheezes. She exhibits no tenderness.  Abdominal: Soft. Bowel sounds are normal. She exhibits no distension. There is no tenderness.  Musculoskeletal: She exhibits no edema or tenderness.  Neurological: She is alert and oriented to person, place, and time. She is not disoriented. She displays no tremor and normal reflexes. A cranial nerve deficit and sensory deficit is present. She exhibits normal muscle tone. Coordination normal. GCS eye subscore is 4. GCS verbal subscore is 5. GCS motor subscore is 6.  Left facial, left arm, and left leg numbness. No weakness.  Normal finger-nose-finger.  No tremor.  Skin:  Skin is warm. Capillary refill takes less than 2 seconds. No rash noted. She is not diaphoretic. No erythema.  Psychiatric: She has a normal mood and affect.  Nursing note and vitals reviewed.    ED Treatments / Results  Labs (all labs ordered are listed, but only abnormal results are displayed) Labs Reviewed  BASIC METABOLIC PANEL - Abnormal; Notable for the following components:      Result Value   Potassium 3.2 (*)    Chloride 100 (*)    Glucose, Bld 224 (*)    Calcium 8.8 (*)    All other components within normal limits  CBC - Abnormal; Notable for the following components:   RDW 17.1 (*)    All other components within normal limits  TROPONIN I    EKG  EKG Interpretation  Date/Time:  Tuesday December 27 2017 17:40:28 EST Ventricular Rate:  79 PR Interval:  120 QRS Duration: 96 QT Interval:  350 QTC Calculation: 401 R Axis:   27 Text Interpretation:  Normal sinus rhythm Nonspecific ST and T wave abnormality Abnormal ECG when compared  to prior, no significant changes seen.  No STEMI Confirmed by Theda Belfast ([id number]) on 12/27/2017 6:04:28 PM       Radiology Dg Chest 2 View  Result Date: 12/27/2017 CLINICAL DATA:  57 y/o female with elevated BP, headache, fatigue, sternal CP, left arm pain, RIGHT leg pain, SOB, fever, dry cough x1-4 days. Leg pain, cough started today. Hx HTN, former smoker EXAM: CHEST  2 VIEW COMPARISON:  03/23/2017 FINDINGS: Irregular nodule with associated linear opacity in the right upper lobe is stable from the prior chest radiographs and a chest CT performed that same date. Follow-up chest CT in 3-6 months was recommended at that time. No follow-up CT is visualized on the time line. There are prominent bronchovascular markings. This is similar to the prior study. No lung consolidation to suggest pneumonia. No evidence of pulmonary edema. Cardiac silhouette is normal in size. No mediastinal or hilar masses. Skeletal structures are demineralized but  intact. IMPRESSION: 1. No acute cardiopulmonary disease. 2. Stable appearing linear and associated nodular opacity in the right upper lobe. Recommend nonemergent reassessment with follow-up chest CT, based on the findings from the CT dated 03/23/2017. Electronically Signed   By: Amie Portland M.D.   On: 12/27/2017 19:10   Ct Head Wo Contrast  Result Date: 12/27/2017 CLINICAL DATA:  Uncontrolled blood pressure with headache and left-sided weakness EXAM: CT HEAD WITHOUT CONTRAST TECHNIQUE: Contiguous axial images were obtained from the base of the skull through the vertex without intravenous contrast. COMPARISON:  None. FINDINGS: Brain: No evidence of acute infarction, hemorrhage, hydrocephalus, extra-axial collection or mass lesion/mass effect. Vascular: No hyperdense vessel or unexpected calcification. Skull: Normal. Negative for fracture or focal lesion. Sinuses/Orbits: No acute finding. Other: None IMPRESSION: Negative.  No CT evidence for acute intracranial abnormality. Electronically Signed   By: Jasmine Pang M.D.   On: 12/27/2017 22:21    Procedures Procedures (including critical care time)  Medications Ordered in ED Medications  prochlorperazine (COMPAZINE) injection 10 mg (10 mg Intravenous Given 12/27/17 2226)  diphenhydrAMINE (BENADRYL) injection 25 mg (25 mg Intravenous Given 12/27/17 2226)     Initial Impression / Assessment and Plan / ED Course  I have reviewed the triage vital signs and the nursing notes.  Pertinent labs & imaging results that were available during my care of the patient were reviewed by me and considered in my medical decision making (see chart for details).     Shareta Fishbaugh is a 57 y.o. female with a past medical history significant for hypertension, thyroid disease, and GERD who presents with headache, high blood pressure, and left-sided numbness.  Patient reports that for the last 4 days, she has had severe headaches.  She describes them as all overhead and 8  out of 10 severity.  They are throbbing and aching.  She reports some photophobia and denies recent trauma.  She reports that yesterday she had several seconds of a thumping right-sided chest pain that resolved.  She also reports a brief episode of left arm throbbing pain that has resolved.  She reports that she has had a dry cough recently but had no persistent symptoms.  No fevers, chills, nausea, vomiting, urinary symptoms, const patient, or diarrhea.  She does report some mild fatigue.  She reports that when her headaches are severe she has some mild blurry vision.  She denies blurry vision currently.  On arrival, patient's blood pressures were found to be in the 170 systolic.  Patient called her PCP earlier today and was  told to come to the ED given the chest pain, left arm pain, headaches, and elevated blood pressures.  On exam, patient was noted to have left face, left arm, and left leg numbness.  Patient reports no weakness and had no weakness on exam.  Patient had normal coordination with finger-nose-finger testing bilaterally.  Patient had normal speech and had no facial droop.  No visual deficits reported.  Lungs clear and chest nontender.  Abdomen nontender.  EKG showed no STEMI.  Patient had CT head showed no acute intracranial abnormalities.  Patient given headache cocktail.  Chest x-ray shows no pneumonia.  There is some scarring seen previously that will likely need outpatient follow-up with CT of the chest.  After her headache improved, her blood pressure was down to the 120 range and normotensive.  Unclear if the blood pressure improved fixing the headache or the headache improved fix the blood pressure.  Unfortunately, patient continues to have left hemibody numbness.  Given the patient's persistent numbness and last normal yesterday, neurology was called.  Neurology recommended MRI to further evaluate.  As this facility is not his MRI at this time, patient will be transferred to  [facility] for ED to ED transfer and MRI of the brain without contrast.  Neurology reports that the patient's MRI is completely normal, she would likely be stable for discharge home however if there is any R normality, patient may need admission.  Hypertensive emergency is considered as etiology of symptoms so his blood pressure returns and her symptoms return, patient may need admission for blood pressure management.  Patient agreed with plan of transfer of care and care transferred in stable condition.    Final Clinical Impressions(s) / ED Diagnoses   Final diagnoses:  Left sided numbness  Hypertension, unspecified type  Nonintractable headache, unspecified chronicity pattern, unspecified headache type   Clinical Impression: 1. Left sided numbness   2. Hypertension, unspecified type   3. Nonintractable headache, unspecified chronicity pattern, unspecified headache type     Disposition: Patient transferred to [facility] for MRI to rule out stroke in the setting of high blood pressure and headaches.      Marsella Suman, Canary Brim, MD 12/28/17 [id number]

## 2017-12-27 NOTE — Consult Note (Signed)
Dorothy AbbeBrenda Williamson is a 57 y.o. that comes in for migraine + left sided numbness, to be transferred, for MRI. ED, MD, does not feel I have to see because signs are only numbness. She got compazine, I agree with transfer for MRI tonight. Last normal was few days prior.

## 2017-12-27 NOTE — ED Notes (Signed)
Pt added she had CP yesterday- and left arm pain today

## 2017-12-27 NOTE — ED Notes (Addendum)
EDP spoke with Neurologist who wants pt transferred to [facility] ED for MRI, Dr. Rush Landmarkegeler spoke with Dr. Nicanor AlconPalumbo who is accepting at [facility].

## 2017-12-27 NOTE — ED Notes (Signed)
Pt expresses slight relief from head pain, numb arm feeling remains the same.

## 2017-12-27 NOTE — ED Notes (Signed)
Pt is aware of plan of care and that she may be put into a hall bed when [facility]

## 2017-12-27 NOTE — ED Triage Notes (Signed)
Pt c/o elevated BP with HA x 4 days-was sent from [facility] for evaluation-states she does have hx HTN and med compliant

## 2017-12-27 NOTE — ED Notes (Signed)
Patient transported to CT 

## 2017-12-28 ENCOUNTER — Emergency Department (HOSPITAL_COMMUNITY): Payer: BLUE CROSS/BLUE SHIELD

## 2017-12-28 NOTE — ED Notes (Signed)
Pt verbalizes understanding of d/c instructions. Pt ambulatory at d/c with all belongings.   

## 2017-12-28 NOTE — Discharge Instructions (Signed)
Please read instructions below. Schedule close follow-up appointment with your primary care provider, to discuss blood pressure management. Schedule an appointment with the neurologist to follow-up on your headache and numbness today.  Return to the ER for severely worsening headache, or new or concerning symptoms.

## 2017-12-28 NOTE — ED Provider Notes (Signed)
[facility] for MRI of the brain, to rule out acute stroke.  Briefly, patient presented to ED for 4 days of headache, left arm and leg numbness, and hypertension.  CT head was negative.  Neurology was consulted, who recommended MRI for further evaluation.  Plan per neurology recommendations is that patient is stable for discharge if MRI is negative.  If MRI is positive, plan to follow-up with neurology, and patient may need admission.  Headache was treated in the ED.  My evaluation, patient states headache is significantly improved, however left-sided numbness persists.  Denies any chest pain or shortness of breath at this time.   5:06 AM MRI is normal.  Pt re-evaluated and reports significant improvement in headache and improving left-sided numbness. Discussed with Dr. Nicanor AlconPalumbo.  Symptoms may have been due to hypertension versus possible atypical migraine.  Patient is safe for discharge at this time with close follow-up with PCP for BP management, and neurology follow-up.    Discussed results, findings, treatment and follow up. Patient advised of return precautions. Patient verbalized understanding and agreed with plan.    Kalen Ratajczak, [place] N, PA-C 12/28/17 0542    Nicanor AlconPalumbo, April, MD 12/28/17 [id number]

## 2018-06-02 DIAGNOSIS — M150 Primary generalized (osteo)arthritis: Secondary | ICD-10-CM | POA: Insufficient documentation

## 2018-07-12 DIAGNOSIS — M67472 Ganglion, left ankle and foot: Secondary | ICD-10-CM | POA: Insufficient documentation

## 2018-09-12 ENCOUNTER — Emergency Department (HOSPITAL_COMMUNITY)
Admission: EM | Admit: 2018-09-12 | Discharge: 2018-09-12 | Disposition: A | Discharge status: Home / Self Care | Payer: BLUE CROSS/BLUE SHIELD | Attending: Emergency Medicine | Admitting: Emergency Medicine

## 2018-09-12 ENCOUNTER — Other Ambulatory Visit: Payer: Self-pay

## 2018-09-12 ENCOUNTER — Encounter (HOSPITAL_COMMUNITY): Payer: Self-pay

## 2018-09-12 ENCOUNTER — Emergency Department (HOSPITAL_COMMUNITY): Payer: BLUE CROSS/BLUE SHIELD

## 2018-09-12 DIAGNOSIS — Z79899 Other long term (current) drug therapy: Secondary | ICD-10-CM | POA: Diagnosis not present

## 2018-09-12 DIAGNOSIS — Z87891 Personal history of nicotine dependence: Secondary | ICD-10-CM | POA: Diagnosis not present

## 2018-09-12 DIAGNOSIS — I10 Essential (primary) hypertension: Secondary | ICD-10-CM | POA: Insufficient documentation

## 2018-09-12 DIAGNOSIS — R0789 Other chest pain: Secondary | ICD-10-CM | POA: Insufficient documentation

## 2018-09-12 LAB — I-STAT BETA HCG BLOOD, ED (NOT ORDERABLE): I-stat hCG, quantitative: <5 m[IU]/mL (ref <5)

## 2018-09-12 LAB — POCT I-STAT TROPONIN I
Troponin i, poc: 0 ng/mL (ref 0.00–0.08)
Troponin i, poc: 0.01 ng/mL (ref 0.00–0.08)

## 2018-09-12 LAB — CBC
HCT: 48.4 % — ABNORMAL HIGH (ref 36.0–46.0)
Hemoglobin: 15.1 g/dL — ABNORMAL HIGH (ref 12.0–15.0)
MCH: 28.9 pg (ref 26.0–34.0)
MCHC: 31.2 g/dL (ref 30.0–36.0)
MCV: 92.5 fL (ref 80.0–100.0)
Platelets: 357 10*3/uL (ref 150–400)
RBC: 5.23 MIL/uL — ABNORMAL HIGH (ref 3.87–5.11)
RDW: 15.1 % (ref 11.5–15.5)
WBC: 7.3 10*3/uL (ref 4.0–10.5)
nRBC: 0 % (ref 0.0–0.2)

## 2018-09-12 LAB — BASIC METABOLIC PANEL
Anion gap: 11 (ref 5–15)
BUN: 15 mg/dL (ref 6–20)
CO2: 28 mmol/L (ref 22–32)
Calcium: 9.3 mg/dL (ref 8.9–10.3)
Chloride: 101 mmol/L (ref 98–111)
Creatinine, Ser: 0.81 mg/dL (ref 0.44–1.00)
GFR calc Af Amer: >60 mL/min (ref >60)
GFR calc non Af Amer: >60 mL/min (ref >60)
Glucose, Bld: 106 mg/dL — ABNORMAL HIGH (ref 70–99)
Potassium: 3.2 mmol/L — ABNORMAL LOW (ref 3.5–5.1)
Sodium: 140 mmol/L (ref 135–145)

## 2018-09-12 NOTE — ED Triage Notes (Signed)
Pt states she has been having left sided chest pain for a minute this morning and broke out into a cold sweat. Pt states that she has a headache and c/o chest fluttering.

## 2018-09-12 NOTE — ED Provider Notes (Signed)
[facility]-EMERGENCY DEPT Provider Note   CSN: [id number] Arrival date & time: 09/12/18  1556     History   Chief Complaint Chief Complaint  Patient presents with  . Chest Pain    HPI Dorothy Williamson is a 57 y.o. female.  57 year old female with past medical history including hypertension, hyperlipidemia, thyroid disease, OSA, GERD who presents with chest pain.  This morning while asleep in bed, she began having left-sided chest pain that she describes as a dull fluttering sensation that lasted less than a minute.  This dull pain has happened a few times throughout today, not associated with exertion.  Nothing makes it better or worse.  She reports some sweatiness today as well as some nausea, no vomiting.  She reports chronic shortness of breath related to her sarcoidosis.  No fevers, cough/cold symptoms, or recent travel.  No leg swelling, history of blood clots, history of cancer, or estrogen use.  No family history of heart disease.  Patient states that she had this chest pain previously and was sent for an outpatient stress test which was normal.  The history is provided by the patient.    Past Medical History:  Diagnosis Date  . Chronic knee pain   . GERD (gastroesophageal reflux disease)   . Hypercholesteremia   . Hypertension   . Nerve pain   . Thyroid disease     Patient Active Problem List   Diagnosis Date Noted  . OBSTRUCTIVE SLEEP APNEA 07/10/2010  . RESTLESS LEGS SYNDROME 07/10/2010  . HYPERLIPIDEMIA 06/23/2010  . HYPERTENSION 06/23/2010  . ARTHRITIS 06/23/2010    Past Surgical History:  Procedure Laterality Date  . ABDOMINAL HYSTERECTOMY    . BREAST SURGERY    . CARDIOVASCULAR STRESS TEST Bilateral 2017   normal results  . CARPAL TUNNEL RELEASE    . CHOLECYSTECTOMY    . JOINT REPLACEMENT       OB History   None      Home Medications    Prior to Admission medications   Medication Sig Start Date End Date Taking?  Authorizing Provider  acetaminophen-codeine (TYLENOL #3) 300-30 MG tablet Take 1 tablet by mouth every 6 (six) hours as needed. for pain 08/29/18  Yes [provider]  amLODipine (NORVASC) 10 MG tablet Take 10 mg by mouth daily.  08/24/18  Yes [provider]  Biotin 1 MG CAPS Take 1 capsule by mouth daily.   Yes [provider]  carbidopa-levodopa (SINEMET IR) 25-100 MG tablet Take 2 tablets by mouth at bedtime.    Yes [provider]  cholecalciferol (VITAMIN D) 1000 units tablet Take 1,000 Units by mouth daily.   Yes [provider]  DULoxetine (CYMBALTA) 60 MG capsule Take 60 mg by mouth 2 (two) times daily.  08/27/18  Yes [provider]  fluticasone (FLONASE) 50 MCG/ACT nasal spray Place 1 spray into both nostrils daily.  08/24/18  Yes [provider]  gabapentin (NEURONTIN) 300 MG capsule Take 300 mg by mouth 3 (three) times daily.   Yes [provider]  hydrochlorothiazide (HYDRODIURIL) 25 MG tablet Take 25 mg by mouth daily.  08/24/18  Yes [provider]  levothyroxine (SYNTHROID, LEVOTHROID) 137 MCG tablet Take 137 mcg by mouth daily before breakfast.   Yes [provider]  magnesium 30 MG tablet Take 30 mg by mouth daily.   Yes [provider]  Omega-3 Fatty Acids (FISH OIL) 1000 MG CAPS Take 1 capsule by mouth daily.  Yes [provider]  POTASSIUM PO Take 1 tablet by mouth daily.   Yes [provider]  simvastatin (ZOCOR) 10 MG tablet Take 10 mg by mouth every evening.    Yes [provider]  zolpidem (AMBIEN) 10 MG tablet Take 10 mg by mouth at bedtime.    Yes [provider]    Family History No family history on file.  Social History Social History   Tobacco Use  . Smoking status: Former Games developer  . Smokeless tobacco: Never Used  Substance Use Topics  . Alcohol use: No    Frequency: Never  . Drug use: No     Allergies   Patient has no  known allergies.   Review of Systems Review of Systems All other systems reviewed and are negative except that which was mentioned in HPI   Physical Exam Updated Vital Signs BP (!) 157/88 (BP Location: Left Arm)   Pulse 65   Temp 98.3 F (36.8 C) (Oral)   Resp 18   Ht 5\' 4"  (1.626 m)   Wt 95.3 kg   SpO2 100%   BMI 36.05 kg/m   Physical Exam  Constitutional: She is oriented to person, place, and time. She appears well-developed and well-nourished. No distress.  HENT:  Head: Normocephalic and atraumatic.  Moist mucous membranes  Eyes: Conjunctivae are normal.  Neck: Neck supple.  Cardiovascular: Normal rate, regular rhythm and normal heart sounds.  No murmur heard. Pulmonary/Chest: Effort normal and breath sounds normal.  Abdominal: Soft. Bowel sounds are normal. She exhibits no distension. There is no tenderness.  Musculoskeletal: She exhibits no edema.  Neurological: She is alert and oriented to person, place, and time.  Fluent speech  Skin: Skin is warm and dry.  Psychiatric: She has a normal mood and affect. Judgment normal.  Nursing note and vitals reviewed.    ED Treatments / Results  Labs (all labs ordered are listed, but only abnormal results are displayed) Labs Reviewed  BASIC METABOLIC PANEL - Abnormal; Notable for the following components:      Result Value   Potassium 3.2 (*)    Glucose, Bld 106 (*)    All other components within normal limits  CBC - Abnormal; Notable for the following components:   RBC 5.23 (*)    Hemoglobin 15.1 (*)    HCT 48.4 (*)    All other components within normal limits  I-STAT TROPONIN, ED  I-STAT BETA HCG BLOOD, ED ([facility], [facility], [facility] ONLY)  POCT I-STAT TROPONIN I  I-STAT BETA HCG BLOOD, ED (NOT ORDERABLE)  I-STAT TROPONIN, ED  POCT I-STAT TROPONIN I    EKG EKG Interpretation  Date/Time:  Tuesday September 12 2018 16:15:10 EDT Ventricular Rate:  85 PR Interval:    QRS Duration: 91 QT Interval:  379 QTC  Calculation: 451 R Axis:   33 Text Interpretation:  Sinus rhythm Borderline repolarization abnormality Baseline wander in lead(s) III aVF V3 Interpretation limited secondary to artifact non-specific T wave changes/flattening similar to previous tracing Confirmed by Frederick Peers [phone]) on 09/12/2018 10:27:37 PM   Radiology Dg Chest 2 View  Result Date: 09/12/2018 CLINICAL DATA:  Left-sided chest pain beginning today. EXAM: CHEST - 2 VIEW COMPARISON:  None. FINDINGS: Heart size is normal. The aorta shows tortuosity. There is pulmonary venous hypertension without frank edema. Patchy density in the right upper lung could be infiltrate or mass lesion. I think there is a snap artifact projected over the left upper lung. No effusions. No acute  bone finding. IMPRESSION: Pulmonary venous hypertension pattern.  No frank edema. Patchy density in the right upper lobe that could be infiltrate, scarring or mass lesion. Electronically Signed   By: Paulina Fusi M.D.   On: 09/12/2018 16:56    Procedures Procedures (including critical care time)  Medications Ordered in ED Medications - No data to display   Initial Impression / Assessment and Plan / ED Course  I have reviewed the triage vital signs and the nursing notes.  Pertinent labs & imaging results that were available during my care of the patient were reviewed by me and considered in my medical decision making (see chart for details).     Well-appearing and comfortable on exam, reassuring vital signs.  EKG similar to previous.  Lab work shows negative serial troponin, chronic mild hypokalemia at 3.2, reassuring CBC.  X-ray with density in right upper lobe, no infectious symptoms to suggest infiltrate and I suspect this is related to her known sarcoidosis.  Patient has no risk factors for PE and given normal heart rate and normal oxygen saturation, I do not feel she needs further work-up for this. Although differential includes ACS, her negative  serial trops and unchanged EKG are reassuring and she has had similar symptoms previously with negative stress. HEART score is 3-4. I feel her description is not suggestive of ACS and she is established w/ PCP, feels comfortable following up for reassessment and consideration of stress testing if symptoms persist.  I have extensively reviewed return precautions with her and she has voiced understanding.  Final Clinical Impressions(s) / ED Diagnoses   Final diagnoses:  Atypical chest pain    ED Discharge Orders    None       Odas Ozer, Ambrose [place], MD 09/12/18 2342

## 2018-12-11 ENCOUNTER — Emergency Department (HOSPITAL_BASED_OUTPATIENT_CLINIC_OR_DEPARTMENT_OTHER)
Admission: EM | Admit: 2018-12-11 | Discharge: 2018-12-11 | Disposition: A | Discharge status: Home / Self Care | Payer: Worker's Compensation | Attending: Emergency Medicine | Admitting: Emergency Medicine

## 2018-12-11 ENCOUNTER — Encounter (HOSPITAL_BASED_OUTPATIENT_CLINIC_OR_DEPARTMENT_OTHER): Payer: Self-pay | Admitting: Emergency Medicine

## 2018-12-11 ENCOUNTER — Other Ambulatory Visit: Payer: Self-pay

## 2018-12-11 ENCOUNTER — Emergency Department (HOSPITAL_BASED_OUTPATIENT_CLINIC_OR_DEPARTMENT_OTHER): Payer: Worker's Compensation

## 2018-12-11 DIAGNOSIS — Z87891 Personal history of nicotine dependence: Secondary | ICD-10-CM | POA: Diagnosis not present

## 2018-12-11 DIAGNOSIS — S8992XA Unspecified injury of left lower leg, initial encounter: Secondary | ICD-10-CM | POA: Diagnosis present

## 2018-12-11 DIAGNOSIS — I10 Essential (primary) hypertension: Secondary | ICD-10-CM | POA: Insufficient documentation

## 2018-12-11 DIAGNOSIS — E079 Disorder of thyroid, unspecified: Secondary | ICD-10-CM | POA: Insufficient documentation

## 2018-12-11 DIAGNOSIS — W228XXA Striking against or struck by other objects, initial encounter: Secondary | ICD-10-CM | POA: Diagnosis not present

## 2018-12-11 DIAGNOSIS — Y9389 Activity, other specified: Secondary | ICD-10-CM | POA: Diagnosis not present

## 2018-12-11 DIAGNOSIS — S80812A Abrasion, left lower leg, initial encounter: Secondary | ICD-10-CM | POA: Diagnosis not present

## 2018-12-11 DIAGNOSIS — Z79899 Other long term (current) drug therapy: Secondary | ICD-10-CM | POA: Insufficient documentation

## 2018-12-11 DIAGNOSIS — Y990 Civilian activity done for income or pay: Secondary | ICD-10-CM | POA: Diagnosis not present

## 2018-12-11 DIAGNOSIS — Y9259 Other trade areas as the place of occurrence of the external cause: Secondary | ICD-10-CM | POA: Insufficient documentation

## 2018-12-11 NOTE — ED Triage Notes (Signed)
Pt reports left leg injury at work, reports car part fell on left leg, skin abrasions noted, no  obvious deformity, ambulated to treatment room with steady gait .

## 2018-12-11 NOTE — ED Provider Notes (Signed)
[facility] EMERGENCY DEPARTMENT Provider Note   CSN: [id number] Arrival date & time: 12/11/18  0923     History   Chief Complaint Chief Complaint  Patient presents with  . Leg Injury    left    HPI Dorothy Williamson is a 58 y.o. female.  The history is provided by the patient and medical records. No language interpreter was used.   Dorothy Williamson is a 58 y.o. female who presents to the Emergency Department complaining of leg injury. She was at work moving a cart when boards fell off the cart and struck the front of her left leg. She had immediate pain to the front of her left leg. She does have pain on walking. She has a history of hypertension. No history of diabetes. Tetanus status is unknown. No additional injuries. Symptoms are mild to moderate and constant nature. Past Medical History:  Diagnosis Date  . Chronic knee pain   . GERD (gastroesophageal reflux disease)   . Hypercholesteremia   . Hypertension   . Nerve pain   . Thyroid disease     Patient Active Problem List   Diagnosis Date Noted  . OBSTRUCTIVE SLEEP APNEA 07/10/2010  . RESTLESS LEGS SYNDROME 07/10/2010  . HYPERLIPIDEMIA 06/23/2010  . HYPERTENSION 06/23/2010  . ARTHRITIS 06/23/2010    Past Surgical History:  Procedure Laterality Date  . ABDOMINAL HYSTERECTOMY    . BREAST SURGERY    . CARDIOVASCULAR STRESS TEST Bilateral 2017   normal results  . CARPAL TUNNEL RELEASE    . CHOLECYSTECTOMY    . JOINT REPLACEMENT       OB History   No obstetric history on file.      Home Medications    Prior to Admission medications   Medication Sig Start Date End Date Taking? Authorizing Provider  acetaminophen-codeine (TYLENOL #3) 300-30 MG tablet Take 1 tablet by mouth every 6 (six) hours as needed. for pain 08/29/18   [provider]  amLODipine (NORVASC) 10 MG tablet Take 10 mg by mouth daily.  08/24/18   [provider]  Biotin 1 MG CAPS Take 1 capsule by mouth daily.     [provider]  carbidopa-levodopa (SINEMET IR) 25-100 MG tablet Take 2 tablets by mouth at bedtime.     [provider]  cholecalciferol (VITAMIN D) 1000 units tablet Take 1,000 Units by mouth daily.    [provider]  DULoxetine (CYMBALTA) 60 MG capsule Take 60 mg by mouth 2 (two) times daily.  08/27/18   [provider]  fluticasone (FLONASE) 50 MCG/ACT nasal spray Place 1 spray into both nostrils daily.  08/24/18   [provider]  gabapentin (NEURONTIN) 300 MG capsule Take 300 mg by mouth 3 (three) times daily.    [provider]  hydrochlorothiazide (HYDRODIURIL) 25 MG tablet Take 25 mg by mouth daily.  08/24/18   [provider]  levothyroxine (SYNTHROID, LEVOTHROID) 137 MCG tablet Take 137 mcg by mouth daily before breakfast.    [provider]  magnesium 30 MG tablet Take 30 mg by mouth daily.    [provider]  Omega-3 Fatty Acids (FISH OIL) 1000 MG CAPS Take 1 capsule by mouth daily.    [provider]  POTASSIUM PO Take 1 tablet by mouth daily.    [provider]  simvastatin (ZOCOR) 10 MG tablet Take 10 mg by mouth every evening.     [provider]  zolpidem (AMBIEN) 10 MG tablet Take 10  mg by mouth at bedtime.     [provider]    Family History No family history on file.  Social History Social History   Tobacco Use  . Smoking status: Former Games developermoker  . Smokeless tobacco: Never Used  Substance Use Topics  . Alcohol use: No    Frequency: Never  . Drug use: No     Allergies   Patient has no known allergies.   Review of Systems Review of Systems  All other systems reviewed and are negative.    Physical Exam Updated Vital Signs BP (!) 143/96   Pulse 73   Temp 97.6 F (36.4 C) (Oral)   Resp 18   Ht 5\' 4"  (1.626 m)   Wt 104.8 kg   SpO2 98%   BMI 39.65 kg/m   Physical Exam Vitals signs and nursing note reviewed.  Constitutional:       Appearance: Normal appearance.  HENT:     Head: Normocephalic and atraumatic.  Neck:     Musculoskeletal: Neck supple.  Cardiovascular:     Rate and Rhythm: Normal rate and regular rhythm.  Pulmonary:     Effort: Pulmonary effort is normal. No respiratory distress.  Musculoskeletal:     Comments: Abrasion with mild local swelling to the left anterior shin. Antalgic gait. There is tenderness to palpation over the anterior lower leg. Flexion extension is intact at the knee. There is no bony tenderness or edema at the ankle.  Skin:    Capillary Refill: Capillary refill takes less than 2 seconds.  Neurological:     Mental Status: She is alert.     Comments: Five out of five strength in the left lower extremity with sensation to light touch intact in the left lower extremity.  Psychiatric:        Mood and Affect: Mood normal.        Behavior: Behavior normal.      ED Treatments / Results  Labs (all labs ordered are listed, but only abnormal results are displayed) Labs Reviewed - No data to display  EKG None  Radiology Dg Tibia/fibula Left  Result Date: 12/11/2018 CLINICAL DATA:  Pt c/o left mid shin pain and tenderness especially when walking after having several boards fall down directly onto her left shin at work this am, multiple small lacerations to left mid shin EXAM: LEFT TIBIA AND FIBULA - 2 VIEW COMPARISON:  05/08/2013 FINDINGS: Interval knee arthroplasty, components projecting in expected location. There is no evidence of fracture or other focal bone lesions. Calcaneal spurs. Soft tissues are unremarkable. IMPRESSION: No acute findings Electronically Signed   By: Corlis Leak  Hassell M.D.   On: 12/11/2018 09:39    Procedures Procedures (including critical care time)  Medications Ordered in ED Medications - No data to display   Initial Impression / Assessment and Plan / ED Course  I have reviewed the triage vital signs and the nursing notes.  Pertinent labs & imaging results  that were available during my care of the patient were reviewed by me and considered in my medical decision making (see chart for details).     Patient here for evaluation of left leg injury. She has an abrasion to her left anterior leg. No evidence of fracture, dislocation or laceration. Counseled patient on local wound care. On record review in epic tetanus vaccine should be up-to-date. Discussed outpatient follow-up and return precautions.  Final Clinical Impressions(s) / ED Diagnoses   Final diagnoses:  Abrasion of left lower extremity,  initial encounter    ED Discharge Orders    None       Tilden Fossaees, Maxmillian Carsey, MD 12/11/18 1018

## 2019-05-05 ENCOUNTER — Emergency Department (HOSPITAL_COMMUNITY)
Admission: EM | Admit: 2019-05-05 | Discharge: 2019-05-05 | Disposition: A | Discharge status: Home / Self Care | Payer: BC Managed Care – PPO | Attending: Emergency Medicine | Admitting: Emergency Medicine

## 2019-05-05 ENCOUNTER — Encounter (HOSPITAL_COMMUNITY): Payer: Self-pay

## 2019-05-05 ENCOUNTER — Other Ambulatory Visit: Payer: Self-pay

## 2019-05-05 ENCOUNTER — Emergency Department (HOSPITAL_COMMUNITY): Payer: BC Managed Care – PPO

## 2019-05-05 DIAGNOSIS — Z79899 Other long term (current) drug therapy: Secondary | ICD-10-CM | POA: Diagnosis not present

## 2019-05-05 DIAGNOSIS — K5792 Diverticulitis of intestine, part unspecified, without perforation or abscess without bleeding: Secondary | ICD-10-CM | POA: Insufficient documentation

## 2019-05-05 DIAGNOSIS — R1032 Left lower quadrant pain: Secondary | ICD-10-CM | POA: Diagnosis present

## 2019-05-05 DIAGNOSIS — Z87891 Personal history of nicotine dependence: Secondary | ICD-10-CM | POA: Insufficient documentation

## 2019-05-05 DIAGNOSIS — E119 Type 2 diabetes mellitus without complications: Secondary | ICD-10-CM | POA: Insufficient documentation

## 2019-05-05 DIAGNOSIS — I10 Essential (primary) hypertension: Secondary | ICD-10-CM | POA: Insufficient documentation

## 2019-05-05 HISTORY — DX: Type 2 diabetes mellitus without complications: E11.9

## 2019-05-05 LAB — URINALYSIS, ROUTINE W REFLEX MICROSCOPIC
Bilirubin Urine: NEGATIVE
Glucose, UA: NEGATIVE mg/dL
Ketones, ur: NEGATIVE mg/dL
Leukocytes,Ua: NEGATIVE
Nitrite: NEGATIVE
Protein, ur: NEGATIVE mg/dL
Specific Gravity, Urine: 1.01 (ref 1.005–1.030)
pH: 6 (ref 5.0–8.0)

## 2019-05-05 LAB — CBC WITH DIFFERENTIAL/PLATELET
Abs Immature Granulocytes: 0.02 10*3/uL (ref 0.00–0.07)
Basophils Absolute: 0.1 10*3/uL (ref 0.0–0.1)
Basophils Relative: 1 %
Eosinophils Absolute: 0.2 10*3/uL (ref 0.0–0.5)
Eosinophils Relative: 2 %
HCT: 45.9 % (ref 36.0–46.0)
Hemoglobin: 15.1 g/dL — ABNORMAL HIGH (ref 12.0–15.0)
Immature Granulocytes: 0 %
Lymphocytes Relative: 29 %
Lymphs Abs: 2.3 10*3/uL (ref 0.7–4.0)
MCH: 29.5 pg (ref 26.0–34.0)
MCHC: 32.9 g/dL (ref 30.0–36.0)
MCV: 89.8 fL (ref 80.0–100.0)
Monocytes Absolute: 0.6 10*3/uL (ref 0.1–1.0)
Monocytes Relative: 8 %
Neutro Abs: 4.7 10*3/uL (ref 1.7–7.7)
Neutrophils Relative %: 60 %
Platelets: 303 10*3/uL (ref 150–400)
RBC: 5.11 MIL/uL (ref 3.87–5.11)
RDW: 15 % (ref 11.5–15.5)
WBC: 7.8 10*3/uL (ref 4.0–10.5)
nRBC: 0 % (ref 0.0–0.2)

## 2019-05-05 LAB — COMPREHENSIVE METABOLIC PANEL
ALT: 22 U/L (ref 0–44)
AST: 27 U/L (ref 15–41)
Albumin: 3.8 g/dL (ref 3.5–5.0)
Alkaline Phosphatase: 106 U/L (ref 38–126)
Anion gap: 9 (ref 5–15)
BUN: 12 mg/dL (ref 6–20)
CO2: 26 mmol/L (ref 22–32)
Calcium: 9 mg/dL (ref 8.9–10.3)
Chloride: 103 mmol/L (ref 98–111)
Creatinine, Ser: 0.86 mg/dL (ref 0.44–1.00)
GFR calc Af Amer: >60 mL/min (ref >60)
GFR calc non Af Amer: >60 mL/min (ref >60)
Glucose, Bld: 94 mg/dL (ref 70–99)
Potassium: 3.6 mmol/L (ref 3.5–5.1)
Sodium: 138 mmol/L (ref 135–145)
Total Bilirubin: 0.6 mg/dL (ref 0.3–1.2)
Total Protein: 7 g/dL (ref 6.5–8.1)

## 2019-05-05 LAB — CBG MONITORING, ED: Glucose-Capillary: 78 mg/dL (ref 70–99)

## 2019-05-05 LAB — URINALYSIS, MICROSCOPIC (REFLEX)
Bacteria, UA: NONE SEEN
Squamous Epithelial / LPF: NONE SEEN (ref 0–5)
WBC, UA: NONE SEEN WBC/hpf (ref 0–5)

## 2019-05-05 LAB — LIPASE, BLOOD: Lipase: 27 U/L (ref 11–51)

## 2019-05-05 MED ORDER — ONDANSETRON HCL 4 MG/2ML IJ SOLN
4.0000 mg | Freq: Once | INTRAMUSCULAR | Status: AC
  Administered 2019-05-05: 4 mg via INTRAVENOUS
  Filled 2019-05-05: qty 2

## 2019-05-05 MED ORDER — IOHEXOL 300 MG/ML  SOLN
100.0000 mL | Freq: Once | INTRAMUSCULAR | Status: AC | PRN
  Administered 2019-05-05: 100 mL via INTRAVENOUS

## 2019-05-05 MED ORDER — MORPHINE SULFATE (PF) 4 MG/ML IV SOLN
4.0000 mg | Freq: Once | INTRAVENOUS | Status: AC
  Administered 2019-05-05: 4 mg via INTRAVENOUS
  Filled 2019-05-05: qty 1

## 2019-05-05 MED ORDER — AMOXICILLIN-POT CLAVULANATE 875-125 MG PO TABS: 1.0000 | ORAL_TABLET | Freq: Two times a day (BID) | ORAL | 0 refills | Status: AC

## 2019-05-05 MED ORDER — AMOXICILLIN-POT CLAVULANATE 875-125 MG PO TABS
1.0000 | ORAL_TABLET | Freq: Once | ORAL | Status: AC
  Administered 2019-05-05: 1 via ORAL
  Filled 2019-05-05: qty 1

## 2019-05-05 NOTE — Discharge Instructions (Signed)
We believe your symptoms are caused by diverticulitis.  Most of the time this condition (please read through the included information) can be cured with outpatient antibiotics.  Please take the full course of prescribed medication(s) and follow up with the doctors recommended above.  Return to the ED if your abdominal pain worsens or fails to improve, you develop bloody vomiting, bloody diarrhea, you are unable to tolerate fluids due to vomiting, fever greater than 101, or other symptoms that concern you.    

## 2019-05-05 NOTE — ED Notes (Signed)
CBG 78. 

## 2019-05-05 NOTE — ED Notes (Signed)
Patient verbalizes understanding of discharge instructions. Opportunity for questioning and answers were provided. Armband removed by staff, pt discharged from ED.  

## 2019-05-05 NOTE — ED Notes (Signed)
ED Provider at bedside. 

## 2019-05-05 NOTE — ED Triage Notes (Addendum)
Pt arrives POV from home c/o "not feeling well" for 1 week and lower abdominal/left groin pain that started this morning when she woke up. Pt states pain is 9/10. Pt a&o x4.

## 2019-05-05 NOTE — ED Notes (Signed)
Patient transported to CT 

## 2019-05-05 NOTE — ED Provider Notes (Signed)
[place] EMERGENCY DEPARTMENT Provider Note   CSN: [id number] Arrival date & time: 05/05/19  1253    History   Chief Complaint Chief Complaint  Patient presents with  . Abdominal Pain    HPI Dorothy Williamson is a 58 y.o. female past medical history of GERD, high pretension, hyperlipidemia, diabetes, thyroidectomy presents emergency department today with chief complaint of abdominal pain x1 day.  Patient states this morning when she woke up she had left lower quadrant pain.  She describes the pain as constant aching and states with movement it is sharp.  She rates the pain 9 out of 10 in severity.  The pain does not radiate.  She called her primary care doctor who recommended she come to the ER for dilation.  Patient reports she has also been feeling weak x5 days.  This week she has just not felt like herself.  She feels more tired than usual and has felt nauseous. Abdominal surgical history includes cholecystectomy, hysterectomy. She denies fever, chills, chest pain, shortness of breath, cough, urinary symptoms, bloody stool.  She admits to diarrhea x 2 months since starting metformin, no changes in bowel movements. Denies any sick contacts, denies any contact with someone positive for covid-19.  Past Medical History:  Diagnosis Date  . Chronic knee pain   . Diabetes mellitus without complication ([place])   . GERD (gastroesophageal reflux disease)   . Hypercholesteremia   . Hypertension   . Nerve pain   . Thyroid disease     Patient Active Problem List   Diagnosis Date Noted  . OBSTRUCTIVE SLEEP APNEA 07/10/2010  . RESTLESS LEGS SYNDROME 07/10/2010  . HYPERLIPIDEMIA 06/23/2010  . HYPERTENSION 06/23/2010  . ARTHRITIS 06/23/2010    Past Surgical History:  Procedure Laterality Date  . ABDOMINAL HYSTERECTOMY    . BREAST SURGERY    . CARDIOVASCULAR STRESS TEST Bilateral 2017   normal results  . CARPAL TUNNEL RELEASE    . CHOLECYSTECTOMY    . JOINT  REPLACEMENT       OB History   No obstetric history on file.      Home Medications    Prior to Admission medications   Medication Sig Start Date End Date Taking? Authorizing Provider  acetaminophen-codeine (TYLENOL #3) 300-30 MG tablet Take 1 tablet by mouth every 6 (six) hours as needed. for pain 08/29/18   [provider]  amLODipine (NORVASC) 10 MG tablet Take 10 mg by mouth daily.  08/24/18   [provider]  amoxicillin-clavulanate (AUGMENTIN) 875-125 MG tablet Take 1 tablet by mouth every 12 (twelve) hours for 7 days. 05/05/19 05/12/19  Stevi Hollinshead E, PA-C  Biotin 1 MG CAPS Take 1 capsule by mouth daily.    [provider]  carbidopa-levodopa (SINEMET IR) 25-100 MG tablet Take 2 tablets by mouth at bedtime.     [provider]  cholecalciferol (VITAMIN D) 1000 units tablet Take 1,000 Units by mouth daily.    [provider]  DULoxetine (CYMBALTA) 60 MG capsule Take 60 mg by mouth 2 (two) times daily.  08/27/18   [provider]  fluticasone (FLONASE) 50 MCG/ACT nasal spray Place 1 spray into both nostrils daily.  08/24/18   [provider]  gabapentin (NEURONTIN) 300 MG capsule Take 300 mg by mouth 3 (three) times daily.    [provider]  hydrochlorothiazide (HYDRODIURIL) 25 MG tablet Take 25 mg by mouth daily.  08/24/18   [provider]  levothyroxine ([place], [place]) 137  MCG tablet Take 137 mcg by mouth daily before breakfast.    [provider]  magnesium 30 MG tablet Take 30 mg by mouth daily.    [provider]  Omega-3 Fatty Acids (FISH OIL) 1000 MG CAPS Take 1 capsule by mouth daily.    [provider]  POTASSIUM PO Take 1 tablet by mouth daily.    [provider]  simvastatin (ZOCOR) 10 MG tablet Take 10 mg by mouth every evening.     [provider]  zolpidem (AMBIEN) 10 MG tablet Take 10 mg by mouth at bedtime.     [provider]     Family History History reviewed. No pertinent family history.  Social History Social History   Tobacco Use  . Smoking status: Former Games developermoker  . Smokeless tobacco: Never Used  Substance Use Topics  . Alcohol use: No    Frequency: Never  . Drug use: No     Allergies   Patient has no known allergies.   Review of Systems Review of Systems  Constitutional: Negative for chills and fever.  HENT: Negative for congestion, ear discharge, ear pain, sinus pressure, sinus pain and sore throat.   Eyes: Negative for pain and redness.  Respiratory: Negative for cough and shortness of breath.   Cardiovascular: Negative for chest pain.  Gastrointestinal: Positive for abdominal pain, diarrhea and nausea. Negative for constipation and vomiting.  Genitourinary: Negative for dysuria and hematuria.  Musculoskeletal: Negative for back pain and neck pain.  Skin: Negative for wound.  Neurological: Positive for weakness. Negative for numbness and headaches.     Physical Exam Updated Vital Signs BP (!) 153/89   Pulse 74   Temp 98 F (36.7 C) (Oral)   Resp 16   Ht 5\' 4"  (1.626 m)   Wt 97.5 kg   SpO2 95%   BMI 36.90 kg/m   Physical Exam Vitals signs and nursing note reviewed.  Constitutional:      General: She is not in acute distress.    Appearance: She is not ill-appearing.  HENT:     Head: Normocephalic and atraumatic.     Right Ear: Tympanic membrane and external ear normal.     Left Ear: Tympanic membrane and external ear normal.     Nose: Nose normal.     Mouth/Throat:     Mouth: Mucous membranes are moist.     Pharynx: Oropharynx is clear.  Eyes:     General: No scleral icterus.       Right eye: No discharge.        Left eye: No discharge.     Extraocular Movements: Extraocular movements intact.     Conjunctiva/sclera: Conjunctivae normal.     Pupils: Pupils are equal, round, and reactive to light.  Neck:     Musculoskeletal: Normal range of motion.     Vascular: No  JVD.  Cardiovascular:     Rate and Rhythm: Normal rate and regular rhythm.     Pulses: Normal pulses.          Radial pulses are 2+ on the right side and 2+ on the left side.     Heart sounds: Normal heart sounds.  Pulmonary:     Comments: Lungs clear to auscultation in all fields. Symmetric chest rise. No wheezing, rales, or rhonchi. Abdominal:     Tenderness: There is no right CVA tenderness or left CVA tenderness.     Comments: Abdomen is soft, non-distended. Tenderness to palpation of  left lower quadrant. No rigidity, no guarding. No peritoneal signs.  Musculoskeletal: Normal range of motion.  Skin:    General: Skin is warm and dry.     Capillary Refill: Capillary refill takes less than 2 seconds.  Neurological:     Mental Status: She is oriented to person, place, and time.     GCS: GCS eye subscore is 4. GCS verbal subscore is 5. GCS motor subscore is 6.     Comments: Fluent speech, no facial droop.  Psychiatric:        Behavior: Behavior normal.      ED Treatments / Results  Labs (all labs ordered are listed, but only abnormal results are displayed) Labs Reviewed  CBC WITH DIFFERENTIAL/PLATELET - Abnormal; Notable for the following components:      Result Value   Hemoglobin 15.1 (*)    All other components within normal limits  URINALYSIS, ROUTINE W REFLEX MICROSCOPIC - Abnormal; Notable for the following components:   Hgb urine dipstick TRACE (*)    All other components within normal limits  LIPASE, BLOOD  COMPREHENSIVE METABOLIC PANEL  URINALYSIS, MICROSCOPIC (REFLEX)  CBG MONITORING, ED    EKG None  Radiology Ct Abdomen Pelvis W Contrast  Result Date: 05/05/2019 CLINICAL DATA:  58 year old female with a history of not feeling well EXAM: CT ABDOMEN AND PELVIS WITH CONTRAST TECHNIQUE: Multidetector CT imaging of the abdomen and pelvis was performed using the standard protocol following bolus administration of intravenous contrast. CONTRAST:  100mL OMNIPAQUE  IOHEXOL 300 MG/ML  SOLN COMPARISON:  10/11/2018 FINDINGS: Lower chest: No acute finding of the lower chest with respiratory motion somewhat limiting. Hepatobiliary: Unremarkable liver.  Cholecystectomy. Pancreas: Unremarkable Spleen: Unremarkable spleen Adrenals/Urinary Tract: Unremarkable adrenal glands. Bilateral kidneys unremarkable with no hydronephrosis or nephrolithiasis. Unremarkable course the bilateral ureters. Unremarkable urinary bladder. Stomach/Bowel: Unremarkable stomach.  Unremarkable small bowel. Normal appendix. Diverticular disease of transverse colon, sigmoid colon/descending colon. Inflammatory changes present in the proximal sigmoid colon related to region of diverticular disease. No evidence of free air or focal abscess. Vascular/Lymphatic: No significant atherosclerotic changes. No adenopathy. Reproductive: Hysterectomy Other: None Musculoskeletal: No acute displaced fracture. Mild degenerative changes of the spine. IMPRESSION: Acute diverticulitis, uncomplicated, of the proximal sigmoid colon. Electronically Signed   By: Gilmer MorJaime  Wagner D.O.   On: 05/05/2019 15:53    Procedures Procedures (including critical care time)  Medications Ordered in ED Medications  amoxicillin-clavulanate (AUGMENTIN) 875-125 MG per tablet 1 tablet (has no administration in time range)  morphine 4 MG/ML injection 4 mg (4 mg Intravenous Given 05/05/19 1327)  ondansetron (ZOFRAN) injection 4 mg (4 mg Intravenous Given 05/05/19 1327)  iohexol (OMNIPAQUE) 300 MG/ML solution 100 mL (100 mLs Intravenous Contrast Given 05/05/19 1535)     Initial Impression / Assessment and Plan / ED Course  I have reviewed the triage vital signs and the nursing notes.  Pertinent labs & imaging results that were available during my care of the patient were reviewed by me and considered in my medical decision making (see chart for details).  Patient is afebrile, overall well-appearing.  Left lower quadrant tenderness on exam.  She is afebrile, well appearing. DDx includes UTI, diverticulitis, kidney stone.  CBC, CMP, Lipase all unremarkable. UA without signs of infection. IV morphine and zofran given. On reassessment pain and nausea have improved. Abdomen is benign. Pt tolerating Po intake in the ED. CT abdomen pelvis shows diverticulitis.  Patient given dose of Augmentin and will be discharged with prescription for the same.  Patient is hemodynamically stable, in NAD, and able to ambulate in the ED. Evaluation does not show pathology that would require ongoing emergent intervention or inpatient treatment. I explained the diagnosis to the patient.  Pain has been managed and has no complaints prior to discharge. Patient is comfortable with above plan and is stable for discharge at this time. All questions were answered prior to disposition.  Strict return precautions for returning to the ED were discussed. Encouraged follow up with PCP within 1 week.  This note was prepared using Dragon voice recognition software and may include unintentional dictation errors due to the inherent limitations of voice recognition software.    Final Clinical Impressions(s) / ED Diagnoses   Final diagnoses:  Diverticulitis    ED Discharge Orders         Ordered    amoxicillin-clavulanate (AUGMENTIN) 875-125 MG tablet  Every 12 hours     05/05/19 1620           Sherene Sireslbrizze, Ceazia Harb E, PA-C 05/05/19 1626    Margarita Grizzleay, Danielle, MD 05/06/19 1635

## 2019-05-07 ENCOUNTER — Emergency Department (HOSPITAL_COMMUNITY)
Admission: EM | Admit: 2019-05-07 | Discharge: 2019-05-07 | Disposition: A | Discharge status: Home / Self Care | Payer: BC Managed Care – PPO | Attending: Emergency Medicine | Admitting: Emergency Medicine

## 2019-05-07 ENCOUNTER — Other Ambulatory Visit: Payer: Self-pay

## 2019-05-07 ENCOUNTER — Encounter (HOSPITAL_COMMUNITY): Payer: Self-pay | Admitting: Student

## 2019-05-07 ENCOUNTER — Emergency Department (HOSPITAL_COMMUNITY): Payer: BC Managed Care – PPO

## 2019-05-07 DIAGNOSIS — E119 Type 2 diabetes mellitus without complications: Secondary | ICD-10-CM | POA: Diagnosis not present

## 2019-05-07 DIAGNOSIS — H538 Other visual disturbances: Secondary | ICD-10-CM | POA: Insufficient documentation

## 2019-05-07 DIAGNOSIS — Z7984 Long term (current) use of oral hypoglycemic drugs: Secondary | ICD-10-CM | POA: Insufficient documentation

## 2019-05-07 DIAGNOSIS — I10 Essential (primary) hypertension: Secondary | ICD-10-CM | POA: Diagnosis not present

## 2019-05-07 DIAGNOSIS — R51 Headache: Secondary | ICD-10-CM | POA: Diagnosis present

## 2019-05-07 DIAGNOSIS — Z79899 Other long term (current) drug therapy: Secondary | ICD-10-CM | POA: Insufficient documentation

## 2019-05-07 DIAGNOSIS — E079 Disorder of thyroid, unspecified: Secondary | ICD-10-CM | POA: Insufficient documentation

## 2019-05-07 DIAGNOSIS — R110 Nausea: Secondary | ICD-10-CM | POA: Diagnosis not present

## 2019-05-07 DIAGNOSIS — R519 Headache, unspecified: Secondary | ICD-10-CM

## 2019-05-07 MED ORDER — DIPHENHYDRAMINE HCL 50 MG/ML IJ SOLN
12.5000 mg | Freq: Once | INTRAMUSCULAR | Status: AC
  Administered 2019-05-07: 12.5 mg via INTRAVENOUS
  Filled 2019-05-07: qty 1

## 2019-05-07 MED ORDER — KETOROLAC TROMETHAMINE 15 MG/ML IJ SOLN
15.0000 mg | Freq: Once | INTRAMUSCULAR | Status: AC
  Administered 2019-05-07: 15 mg via INTRAVENOUS
  Filled 2019-05-07: qty 1

## 2019-05-07 MED ORDER — BUTALBITAL-APAP-CAFFEINE 50-325-40 MG PO TABS
1.0000 | ORAL_TABLET | Freq: Once | ORAL | Status: AC
  Administered 2019-05-07: 1 via ORAL
  Filled 2019-05-07: qty 1

## 2019-05-07 MED ORDER — SODIUM CHLORIDE 0.9 % IV SOLN: INTRAVENOUS | Status: DC

## 2019-05-07 MED ORDER — SODIUM CHLORIDE 0.9 % IV BOLUS
1000.0000 mL | Freq: Once | INTRAVENOUS | Status: AC
  Administered 2019-05-07: 1000 mL via INTRAVENOUS

## 2019-05-07 MED ORDER — BUTALBITAL-APAP-CAFFEINE 50-325-40 MG PO TABS: 1.0000 | ORAL_TABLET | Freq: Three times a day (TID) | ORAL | 0 refills | Status: AC | PRN

## 2019-05-07 MED ORDER — PROCHLORPERAZINE EDISYLATE 10 MG/2ML IJ SOLN
10.0000 mg | Freq: Once | INTRAMUSCULAR | Status: AC
  Administered 2019-05-07: 10 mg via INTRAVENOUS
  Filled 2019-05-07: qty 2

## 2019-05-07 NOTE — Discharge Instructions (Addendum)
You were seen in the emergency department today for a headache.  Your CT scan was normal.  You received a migraine cocktail with other medicines.  We are sending you home with Fioricet, a medicine to help if the headache returns.  Please take this as prescribed.  Please not drive or operate heavy machinery when taking this medicine.  We have prescribed you new medication(s) today. Discuss the medications prescribed today with your pharmacist as they can have adverse effects and interactions with your other medicines including over the counter and prescribed medications. Seek medical evaluation if you start to experience new or abnormal symptoms after taking one of these medicines, seek care immediately if you start to experience difficulty breathing, feeling of your throat closing, facial swelling, or rash as these could be indications of a more serious allergic reaction  Follow-up with primary care within the next 1 to 3 days.  Return to the ER for new or worsening symptoms including but not limited to acute worsening of headache, change of headache, vomiting, change in vision, numbness, weakness, dizziness, passing out, or any other concerns.

## 2019-05-07 NOTE — ED Triage Notes (Signed)
Pt in with migraine x 3 days, states she went to ED 3 days ago and has been on abx for UTI and diverticulitis since. States her HA began that evening, denies any fevers or neuro symptoms.

## 2019-05-07 NOTE — ED Provider Notes (Signed)
[facility]ERG[facility]Y DEPARTMENT Provider Note   CSN: [id number] Arrival date & time: 05/07/19  4315     History   Chief Complaint Chief Complaint  Patient presents with  . Migraine    HPI Dorothy Williamson is a 58 y.o. female w/ a hx of DM, HTN, hypercholesterolemia, & OSA who presents to the ED w/ complaints of headache that began 2 days prior. Patient reports headache had fairly quick progression @ onset, but not necessarily thunderclap in nature or sudden, and has been persistent since. She notes pain is a pressure/stabbing/throbbing that is constant to the generalized head but worse in the frontal region. Current pain is a 9/10 in severity, no alleviating/aggravating factors. She noted associated nausea w/ very mild blurry vision. No hx of similar headaches. Denies fever, chills, neck stiffness, emesis, weakness, numbness, dizziness, or syncope. Pain did not have onset during exertional activity of any kind.      HPI  Past Medical History:  Diagnosis Date  . Chronic knee pain   . Diabetes mellitus without complication ([place])   . GERD (gastroesophageal reflux disease)   . Hypercholesteremia   . Hypertension   . Nerve pain   . Thyroid disease     Patient Active Problem List   Diagnosis Date Noted  . OBSTRUCTIVE SLEEP APNEA 07/10/2010  . RESTLESS LEGS SYNDROME 07/10/2010  . HYPERLIPIDEMIA 06/23/2010  . HYPERTENSION 06/23/2010  . ARTHRITIS 06/23/2010    Past Surgical History:  Procedure Laterality Date  . ABDOMINAL HYSTERECTOMY    . BREAST SURGERY    . CARDIOVASCULAR STRESS TEST Bilateral 2017   normal results  . CARPAL TUNNEL RELEASE    . CHOLECYSTECTOMY    . JOINT REPLACEMENT       OB History   No obstetric history on file.      Home Medications    Prior to Admission medications   Medication Sig Start Date End Date Taking? Authorizing Provider  acetaminophen-codeine (TYLENOL #3) 300-30 MG tablet Take 1 tablet by mouth every 6 (six)  hours as needed. for pain 08/29/18   [provider]  amLODipine (NORVASC) 10 MG tablet Take 10 mg by mouth daily.  08/24/18   [provider]  amoxicillin-clavulanate (AUGMENTIN) 875-125 MG tablet Take 1 tablet by mouth every 12 (twelve) hours for 7 days. 05/05/19 05/12/19  Albrizze, Kaitlyn E, PA-C  Biotin 1 MG CAPS Take 1 capsule by mouth daily.    [provider]  carbidopa-levodopa (SINEMET IR) 25-100 MG tablet Take 2 tablets by mouth at bedtime.     [provider]  cetirizine (ZYRTEC) 10 MG tablet Take 10 mg by mouth daily.    [provider]  cholecalciferol (VITAMIN D) 1000 units tablet Take 1,000 Units by mouth daily.    [provider]  DULoxetine (CYMBALTA) 60 MG capsule Take 60 mg by mouth daily.  08/27/18   [provider]  fluticasone (FLONASE) 50 MCG/ACT nasal spray Place 1 spray into both nostrils daily.  08/24/18   [provider]  gabapentin (NEURONTIN) 300 MG capsule Take 300 mg by mouth 3 (three) times daily.    [provider]  hydrochlorothiazide (HYDRODIURIL) 25 MG tablet Take 25 mg by mouth daily.  08/24/18   [provider]  HYDROcodone-acetaminophen (NORCO/VICODIN) 5-325 MG tablet Take 1 tablet by mouth every 6 (six) hours as needed for pain. 02/07/19   [provider]  levothyroxine (SYNTHROID, LEVOTHROID) 137 MCG tablet Take 137 mcg by mouth daily before  breakfast.    [provider]  magnesium 30 MG tablet Take 30 mg by mouth daily.    [provider]  metFORMIN (GLUCOPHAGE) 500 MG tablet Take 500 mg by mouth 2 (two) times daily before a meal. 03/22/19   [provider]  Omega-3 Fatty Acids (FISH OIL) 1000 MG CAPS Take 1 capsule by mouth daily.    [provider]  pantoprazole (PROTONIX) 20 MG tablet Take 20 mg by mouth daily. 03/14/19   [provider]  potassium chloride (K-DUR) 10 MEQ tablet Take 1 tablet by mouth See admin  instructions. Patient to take 2 tablets by mouth on Mon, Wed, Fri. 04/03/19   [provider]  POTASSIUM PO Take 1 tablet by mouth daily.    [provider]  simvastatin (ZOCOR) 10 MG tablet Take 10 mg by mouth every evening.     [provider]  zolpidem (AMBIEN) 10 MG tablet Take 10 mg by mouth at bedtime.     [provider]    Family History No family history on file.  Social History Social History   Tobacco Use  . Smoking status: Former Games developermoker  . Smokeless tobacco: Never Used  Substance Use Topics  . Alcohol use: No    Frequency: Never  . Drug use: No     Allergies   Patient has no known allergies.   Review of Systems Review of Systems  Constitutional: Negative for chills and fever.  Eyes: Positive for visual disturbance. Negative for photophobia and pain.  Respiratory: Negative for shortness of breath.   Cardiovascular: Negative for chest pain.  Gastrointestinal: Positive for nausea. Negative for abdominal pain and vomiting.  Neurological: Positive for headaches. Negative for dizziness, seizures, syncope, facial asymmetry, weakness, light-headedness and numbness.  All other systems reviewed and are negative.  Physical Exam Updated Vital Signs BP (!) 159/86 (BP Location: Right Arm)   Pulse 78   Temp 98.1 F (36.7 C) (Oral)   Resp 15   Ht 5\' 4"  (1.626 m)   Wt 97.5 kg   SpO2 97%   BMI 36.90 kg/m   Physical Exam Vitals signs and nursing note reviewed.  Constitutional:      General: She is not in acute distress.    Appearance: She is well-developed.  HENT:     Head: Normocephalic and atraumatic.     Right Ear: Ear canal normal. Tympanic membrane is not perforated, erythematous, retracted or bulging.     Left Ear: Ear canal normal. Tympanic membrane is not perforated, erythematous, retracted or bulging.     Ears:     Comments: No mastoid erythema/swelling/tenderness.     Nose:     Right Sinus: No maxillary sinus  tenderness or frontal sinus tenderness.     Left Sinus: No maxillary sinus tenderness or frontal sinus tenderness.     Mouth/Throat:     Pharynx: Uvula midline. No oropharyngeal exudate or posterior oropharyngeal erythema.     Comments: Posterior oropharynx is symmetric appearing. Patient tolerating own secretions without difficulty. No trismus. No drooling. No hot potato voice. No swelling beneath the tongue, submandibular compartment is soft.  Eyes:     General: Vision grossly intact.        Right eye: No discharge.        Left eye: No discharge.     Conjunctiva/sclera: Conjunctivae normal.     Pupils: Pupils are equal, round, and reactive to light.     Comments: No proptosis.   Neck:  Musculoskeletal: Normal range of motion and neck supple. No edema or neck rigidity.  Cardiovascular:     Rate and Rhythm: Normal rate and regular rhythm.     Heart sounds: No murmur.  Pulmonary:     Effort: Pulmonary effort is normal. No respiratory distress.     Breath sounds: Normal breath sounds. No wheezing, rhonchi or rales.  Abdominal:     General: There is no distension.     Palpations: Abdomen is soft.     Tenderness: There is no abdominal tenderness.  Lymphadenopathy:     Cervical: No cervical adenopathy.  Skin:    General: Skin is warm and dry.     Findings: No rash.  Neurological:     Mental Status: She is alert.     Comments: Alert. Clear speech. No facial droop. CNIII-XII grossly intact. Bilateral upper and lower extremities' sensation grossly intact. 5/5 symmetric strength with grip strength and with plantar and dorsi flexion bilaterally. No discoordination with finger to nose bilaterally. Negative pronator drift. Negative Romberg sign. Gait is steady and intact.   Psychiatric:        Behavior: Behavior normal.    ED Treatments / Results  Labs (all labs ordered are listed, but only abnormal results are displayed) Labs Reviewed - No data to display  EKG    Radiology Ct  Abdomen Pelvis W Contrast  Result Date: 05/05/2019 CLINICAL DATA:  58 year old female with a history of not feeling well EXAM: CT ABDOMEN AND PELVIS WITH CONTRAST TECHNIQUE: Multidetector CT imaging of the abdomen and pelvis was performed using the standard protocol following bolus administration of intravenous contrast. CONTRAST:  100mL OMNIPAQUE IOHEXOL 300 MG/ML  SOLN COMPARISON:  10/11/2018 FINDINGS: Lower chest: No acute finding of the lower chest with respiratory motion somewhat limiting. Hepatobiliary: Unremarkable liver.  Cholecystectomy. Pancreas: Unremarkable Spleen: Unremarkable spleen Adrenals/Urinary Tract: Unremarkable adrenal glands. Bilateral kidneys unremarkable with no hydronephrosis or nephrolithiasis. Unremarkable course the bilateral ureters. Unremarkable urinary bladder. Stomach/Bowel: Unremarkable stomach.  Unremarkable small bowel. Normal appendix. Diverticular disease of transverse colon, sigmoid colon/descending colon. Inflammatory changes present in the proximal sigmoid colon related to region of diverticular disease. No evidence of free air or focal abscess. Vascular/Lymphatic: No significant atherosclerotic changes. No adenopathy. Reproductive: Hysterectomy Other: None Musculoskeletal: No acute displaced fracture. Mild degenerative changes of the spine. IMPRESSION: Acute diverticulitis, uncomplicated, of the proximal sigmoid colon. Electronically Signed   By: Gilmer MorJaime  Wagner D.O.   On: 05/05/2019 15:53    Procedures Procedures (including critical care time)  Medications Ordered in ED Medications - No data to display   Initial Impression / Assessment and Plan / ED Course  I have reviewed the triage vital signs and the nursing notes.  Pertinent labs & imaging results that were available during my care of the patient were reviewed by me and considered in my medical decision making (see chart for details).  Patient presents to the emergency department with complaints of  headache.  Patient nontoxic-appearing, no apparent distress, vitals WNL with the exception of mildly elevated blood pressure, low suspicion for hypertension emergency. CT head obtained to evaluate for obvious bleed or mass and negative for acute abnormality.  Patient is overall well-appearing, no focal neurologic deficits, no nuchal rigidity, no fevers, vision grossly intact- doubt ischemic CVA, meningitis, giant cell arteritis, or dural venous sinus thrombosis.  Headache did not have sudden/thunderclap onset, did progress somewhat quickly, given we are almost 48 hours out patient's pain resolved with migraine cocktail feel that subarachnoid hemorrhage  is unlikely. Patient feeling much better on re-assessment, states she is ready to go home. Will provide fioricet to take as needed. [place] Controlled Substance reporting System queried. I discussed treatment plan, need for PCP follow-up, and return precautions with the patient. Provided opportunity for questions, patient confirmed understanding and is in agreement with plan.   Findings and plan of care discussed with supervising physician Dr. Adela LankFloyd who is in agreement.   Final Clinical Impressions(s) / ED Diagnoses   Final diagnoses:  Acute nonintractable headache, unspecified headache type    ED Discharge Orders         Ordered    butalbital-acetaminophen-caffeine (FIORICET) 50-325-40 MG tablet  Every 8 hours PRN     05/07/19 1338           Ivone Licht, EvantSamantha R, PA-C 05/07/19 1348    Melene PlanFloyd, Dan, DO 05/07/19 1545

## 2019-05-07 NOTE — ED Triage Notes (Signed)
Pt reports seen here Saturday for abdominal pain, went home developed migraine in forehead and back of neck. Pt awake, alert, appropriate, VSS.

## 2019-06-20 DIAGNOSIS — E1165 Type 2 diabetes mellitus with hyperglycemia: Secondary | ICD-10-CM | POA: Insufficient documentation

## 2019-10-24 ENCOUNTER — Encounter (HOSPITAL_COMMUNITY): Payer: Self-pay

## 2019-10-24 ENCOUNTER — Other Ambulatory Visit: Payer: Self-pay

## 2019-10-24 ENCOUNTER — Emergency Department (HOSPITAL_COMMUNITY)
Admission: EM | Admit: 2019-10-24 | Discharge: 2019-10-24 | Disposition: A | Discharge status: Home / Self Care | Payer: BC Managed Care – PPO | Attending: Emergency Medicine | Admitting: Emergency Medicine

## 2019-10-24 DIAGNOSIS — Z7984 Long term (current) use of oral hypoglycemic drugs: Secondary | ICD-10-CM | POA: Diagnosis not present

## 2019-10-24 DIAGNOSIS — Z87891 Personal history of nicotine dependence: Secondary | ICD-10-CM | POA: Diagnosis not present

## 2019-10-24 DIAGNOSIS — I83899 Varicose veins of unspecified lower extremities with other complications: Secondary | ICD-10-CM | POA: Diagnosis present

## 2019-10-24 DIAGNOSIS — Z79899 Other long term (current) drug therapy: Secondary | ICD-10-CM | POA: Insufficient documentation

## 2019-10-24 DIAGNOSIS — I10 Essential (primary) hypertension: Secondary | ICD-10-CM | POA: Insufficient documentation

## 2019-10-24 DIAGNOSIS — E119 Type 2 diabetes mellitus without complications: Secondary | ICD-10-CM | POA: Insufficient documentation

## 2019-10-24 NOTE — Discharge Instructions (Addendum)
You have been diagnosed today with bleeding varicose vein.  At this time there does not appear to be the presence of an emergent medical condition, however there is always the potential for conditions to change. Please read and follow the below instructions.  Please return to the Emergency Department immediately for any new or worsening symptoms. Please be sure to follow up with your Primary Care Provider within one week regarding your visit today; please call their office to schedule an appointment even if you are feeling better for a follow-up visit. A nonstick dressing has been applied to your varicose vein today.  Leave this on for the rest of the day, you may change your bandage tomorrow.  If you have bleeding from the area and you notice it is stuck to your bandage do not attempt to rip off the bandage, use the technique that you were shown today and soak the area with clean water so that will you do not pull off your scab. Please call your primary care doctor's office today to schedule a follow-up appointment regarding your varicose vein.  Get help right away if: You have chest pain. You have trouble breathing. You have severe leg pain. Your legs and feet are turning blue or black. Your legs swell and harden. You have any new/concerning or worsening of symptoms.  Please read the additional information packets attached to your discharge summary.  Do not take your medicine if  develop an itchy rash, swelling in your mouth or lips, or difficulty breathing; call 911 and seek immediate emergency medical attention if this occurs.  Note: Portions of this text may have been transcribed using voice recognition software. Every effort was made to ensure accuracy; however, inadvertent computerized transcription errors may still be present.

## 2019-10-24 NOTE — ED Provider Notes (Signed)
[facility]   CSN: [id number] Arrival date & time: 10/24/19  1135     History   Chief Complaint No chief complaint on file.   HPI Dorothy Williamson is a 58 y.o. female history diabetes, GERD, cholesterol, hypertension, thyroid disease, RLS.  Patient presents today for bleeding from the right lower leg.  She reports she has a varicose vein that has been bleeding for the past 6 days.  Bleeding started on Thanksgiving night.  Patient does not remember any injury to the area reports bleeding was spontaneous.  She reports that she had a varicose vein in that area for multiple months.  She reports spreading blood to the area she applied direct pressure and bandaging immediately.  She reports that she has attempted to change her bandage every day for the past 6 days after each time bleeding returns.  She denies any pain to the area or blood thinner use.  She is advised to come to the ER today by her PCP.  Denies fever/chills, headache, chest pain/shortness of breath, abdominal pain, nausea/vomiting, diarrhea, numbness/tingling, weakness, injury to the area, blood thinner use or any additional concerns.    HPI  Past Medical History:  Diagnosis Date  . Chronic knee pain   . Diabetes mellitus without complication ([facility])   . GERD (gastroesophageal reflux disease)   . Hypercholesteremia   . Hypertension   . Nerve pain   . Thyroid disease     Patient Active Problem List   Diagnosis Date Noted  . OBSTRUCTIVE SLEEP APNEA 07/10/2010  . RESTLESS LEGS SYNDROME 07/10/2010  . HYPERLIPIDEMIA 06/23/2010  . HYPERTENSION 06/23/2010  . ARTHRITIS 06/23/2010    Past Surgical History:  Procedure Laterality Date  . ABDOMINAL HYSTERECTOMY    . BREAST SURGERY    . CARDIOVASCULAR STRESS TEST Bilateral 2017   normal results  . CARPAL TUNNEL RELEASE    . CHOLECYSTECTOMY    . JOINT REPLACEMENT       OB History   No obstetric history on file.       Home Medications    Prior to Admission medications   Medication Sig Start Date End Date Taking? Authorizing Provider  acetaminophen-codeine (TYLENOL #3) 300-30 MG tablet Take 1 tablet by mouth every 6 (six) hours as needed. for pain 08/29/18   [provider]  amLODipine (NORVASC) 10 MG tablet Take 10 mg by mouth daily.  08/24/18   [provider]  Biotin 1 MG CAPS Take 1 capsule by mouth daily.    [provider]  butalbital-acetaminophen-caffeine (FIORICET) 50-325-40 MG tablet Take 1-2 tablets by mouth every 8 (eight) hours as needed for headache. 05/07/19 05/06/20  Petrucelli, Lelon MastSamantha R, PA-C  carbidopa-levodopa (SINEMET IR) 25-100 MG tablet Take 2 tablets by mouth at bedtime.     [provider]  cetirizine (ZYRTEC) 10 MG tablet Take 10 mg by mouth daily.    [provider]  cholecalciferol (VITAMIN D) 1000 units tablet Take 1,000 Units by mouth daily.    [provider]  DULoxetine (CYMBALTA) 60 MG capsule Take 60 mg by mouth daily.  08/27/18   [provider]  fluticasone (FLONASE) 50 MCG/ACT nasal spray Place 1 spray into both nostrils daily.  08/24/18   [provider]  gabapentin (NEURONTIN) 300 MG capsule Take 300 mg by mouth 3 (three) times daily.    [provider]  hydrochlorothiazide (HYDRODIURIL) 25 MG tablet Take 25 mg by mouth daily.  08/24/18  [provider]  HYDROcodone-acetaminophen (NORCO/VICODIN) 5-325 MG tablet Take 1 tablet by mouth every 6 (six) hours as needed for pain. 02/07/19   [provider]  levothyroxine (SYNTHROID, LEVOTHROID) 137 MCG tablet Take 137 mcg by mouth daily before breakfast.    [provider]  magnesium 30 MG tablet Take 30 mg by mouth daily.    [provider]  metFORMIN (GLUCOPHAGE) 500 MG tablet Take 500 mg by mouth 2 (two) times daily before a meal. 03/22/19   [provider]  Omega-3 Fatty Acids (FISH OIL) 1000 MG CAPS Take  1 capsule by mouth daily.    [provider]  pantoprazole (PROTONIX) 20 MG tablet Take 20 mg by mouth daily. 03/14/19   [provider]  potassium chloride (K-DUR) 10 MEQ tablet Take 1 tablet by mouth See admin instructions. Patient to take 2 tablets by mouth on Mon, Wed, Fri. 04/03/19   [provider]  POTASSIUM PO Take 1 tablet by mouth daily.    [provider]  simvastatin (ZOCOR) 10 MG tablet Take 10 mg by mouth every evening.     [provider]  zolpidem (AMBIEN) 10 MG tablet Take 10 mg by mouth at bedtime.     [provider]    Family History History reviewed. No pertinent family history.  Social History Social History   Tobacco Use  . Smoking status: Former Games developer  . Smokeless tobacco: Never Used  Substance Use Topics  . Alcohol use: No    Frequency: Never  . Drug use: No     Allergies   Patient has no known allergies.   Review of Systems Review of Systems Ten systems are reviewed and are negative for acute change except as noted in the HPI   Physical Exam Updated Vital Signs BP (!) 158/87 (BP Location: Right Arm)   Pulse 75   Temp 98.4 F (36.9 C) (Oral)   Resp 15   SpO2 100%   Physical Exam Constitutional:      General: She is not in acute distress.    Appearance: Normal appearance. She is well-developed. She is not ill-appearing or diaphoretic.  HENT:     Head: Normocephalic and atraumatic.     Right Ear: External ear normal.     Left Ear: External ear normal.     Nose: Nose normal.  Eyes:     General: Vision grossly intact. Gaze aligned appropriately.     Pupils: Pupils are equal, round, and reactive to light.  Neck:     Musculoskeletal: Normal range of motion.     Trachea: Trachea and phonation normal. No tracheal deviation.  Cardiovascular:     Pulses:          Dorsalis pedis pulses are 2+ on the right side and 2+ on the left side.  Pulmonary:     Effort: Pulmonary effort is normal. No  respiratory distress.  Abdominal:     General: There is no distension.     Palpations: Abdomen is soft.     Tenderness: There is no abdominal tenderness. There is no guarding or rebound.  Musculoskeletal: Normal range of motion.  Feet:     Right foot:     Protective Sensation: 5 sites tested. 5 sites sensed.     Left foot:     Protective Sensation: 5 sites tested. 5 sites sensed.  Skin:    General: Skin is warm and dry.     Capillary Refill: Capillary refill takes less  than 2 seconds.          Comments: Subcentimeter varicose vein with scabbing, no active bleeding.  No swelling, induration, fluctuance, erythema or increased warmth.  See picture below.  Neurological:     Mental Status: She is alert.     GCS: GCS eye subscore is 4. GCS verbal subscore is 5. GCS motor subscore is 6.     Comments: Speech is clear and goal oriented, follows commands Major Cranial nerves without deficit, no facial droop Normal strength in lower extremities bilaterally including dorsiflexion and plantar flexion Sensation normal to light and sharp touch Moves extremities without ataxia, coordination intact  Psychiatric:        Behavior: Behavior normal.        ED Treatments / Results  Labs (all labs ordered are listed, but only abnormal results are displayed) Labs Reviewed - No data to display  EKG None  Radiology No results found.  Procedures Procedures (including critical care time)  Medications Ordered in ED Medications - No data to display   Initial Impression / Assessment and Plan / ED Course  I have reviewed the triage vital signs and the nursing notes.  Pertinent labs & imaging results that were available during my care of the patient were reviewed by me and considered in my medical decision making (see chart for details).    58 year old female with bleeding varicose vein for 6 days.  She has been applying gauze to the area and daily and has been taking the gauze off without  soaking them first.  This has led to the patient tearing off her clot and causing rebleeding.  When she arrived a large number of gauze was wrapped to the area, I soaked this area with sterile water and remove the bandage.  There was no active bleeding area is well-appearing no evidence of cellulitis.  Additionally she is neurovascular intact to lower extremity with good capillary refill and sensation, equal pedal pulses and good range of motion at the knee, ankle foot and toes.  Compartments are soft there is no evidence for cellulitis, septic arthritis, DVT, compartment syndrome or any emergent pathologies at this time.  Petroleum gauze was placed over the area and wrapped with a sterile gauze by nursing staff.  I advised patient on wound care so that she may avoid removing clot in the future.  I advised her to call her primary care doctor's office after she leaves the ED today to schedule a follow-up appointment for recheck and further management.  Additionally patient is not on any blood thinners and has no symptoms suggestive of anemia, there is no indication for any imaging, blood work or further work-up here in this emergency department.  At this time there does not appear to be any evidence of an acute emergency medical condition and the patient appears stable for discharge with appropriate outpatient follow up. Diagnosis was discussed with patient who verbalizes understanding of care plan and is agreeable to discharge. I have discussed return precautions with patient who verbalizes understanding of return precautions. Patient encouraged to follow-up with their PCP All questions answered.  Case was discussed with Dr. Langston Masker who agrees with work-up and discharge with PCP follow-up.  Note: Portions of this report may have been transcribed using voice recognition software. Every effort was made to ensure accuracy; however, inadvertent computerized transcription errors may still be present. Final Clinical  Impressions(s) / ED Diagnoses   Final diagnoses:  Bleeding from varicose vein    ED  Discharge Orders    None       Elizabeth Palau 10/24/19 1426    Terald Sleeper, MD 10/24/19 1744

## 2019-10-24 NOTE — ED Triage Notes (Signed)
Pt c/o Right lower leg "spurting blood" whenever she takes dressing off.  States on 6 days ago vein started bleeding, call EMS, EMS wrapped leg.  Unwrapped dressing next day and bleeding re-started "spurting".  Pt went to vein specialist on 10-22-19 and was told she had a "vein going the wrong way".  Called PCP today who advised to come to ED.

## 2019-10-29 ENCOUNTER — Telehealth: Payer: Self-pay

## 2019-10-29 NOTE — Telephone Encounter (Signed)
NOTES ON [place] [phone].SENT REFERRAL TO SCHEDULING....

## 2019-10-30 ENCOUNTER — Telehealth: Payer: Self-pay

## 2019-10-30 NOTE — Telephone Encounter (Signed)
ERROR

## 2019-10-31 ENCOUNTER — Telehealth: Payer: Self-pay

## 2019-10-31 ENCOUNTER — Telehealth (HOSPITAL_COMMUNITY): Payer: Self-pay

## 2019-10-31 NOTE — Telephone Encounter (Signed)
After speaking with Dr Gwenlyn Found - in agreement that he does not treat varicose veins. Called pt and advised to call PCP to get a referral to a vascular surgeon. Pt stated her insurance co said we were in the network to cover vascular issues. Advised pt to call PCP about referral and to double check with insurance co about vascular coverage in her network. Canceled appt for 12/10. Pt verbalized understanding.

## 2019-10-31 NOTE — Telephone Encounter (Signed)

## 2019-11-01 ENCOUNTER — Other Ambulatory Visit: Payer: Self-pay

## 2019-11-01 ENCOUNTER — Ambulatory Visit (HOSPITAL_COMMUNITY)
Admission: RE | Admit: 2019-11-01 | Discharge: 2019-11-01 | Disposition: A | Discharge status: Home / Self Care | Payer: BC Managed Care – PPO | Source: Ambulatory Visit | Attending: Family | Admitting: Family

## 2019-11-01 ENCOUNTER — Ambulatory Visit: Payer: BC Managed Care – PPO | Admitting: Cardiovascular Disease

## 2019-11-01 ENCOUNTER — Encounter: Payer: Self-pay | Admitting: *Deleted

## 2019-11-01 ENCOUNTER — Ambulatory Visit (INDEPENDENT_AMBULATORY_CARE_PROVIDER_SITE_OTHER): Payer: BC Managed Care – PPO | Admitting: Family

## 2019-11-01 ENCOUNTER — Encounter: Payer: Self-pay | Admitting: Family

## 2019-11-01 VITALS — BP 139/92 | HR 97 | Temp 97.2°F | Resp 16 | Ht 64.0 in | Wt 207.0 lb

## 2019-11-01 DIAGNOSIS — I83893 Varicose veins of bilateral lower extremities with other complications: Secondary | ICD-10-CM | POA: Diagnosis present

## 2019-11-01 DIAGNOSIS — I872 Venous insufficiency (chronic) (peripheral): Secondary | ICD-10-CM

## 2019-11-01 NOTE — Patient Instructions (Signed)
To decrease swelling in your feet and legs: Elevate feet above slightly bent knees, feet above heart, overnight and 3-4 times per day for 20 minutes.    Chronic Venous Insufficiency Chronic venous insufficiency is a condition where the leg veins cannot effectively pump blood from the legs to the heart. This happens when the vein walls are either stretched, weakened, or damaged, or when the valves inside the vein are damaged. With the right treatment, you should be able to continue with an active life. This condition is also called venous stasis. What are the causes? Common causes of this condition include:  High blood pressure inside the veins (venous hypertension).  Sitting or standing too long, causing increased blood pressure in the leg veins.  A blood clot that blocks blood flow in a vein (deep vein thrombosis, DVT).  Inflammation of a vein (phlebitis) that causes a blood clot to form.  Tumors in the pelvis that cause blood to back up. What increases the risk? The following factors may make you more likely to develop this condition:  Having a family history of this condition.  Obesity.  Pregnancy.  Living without enough regular physical activity or exercise (sedentary lifestyle).  Smoking.  Having a job that requires long periods of standing or sitting in one place.  Being a certain age. Women in their 40s and 50s and men in their 70s are more likely to develop this condition. What are the signs or symptoms? Symptoms of this condition include:  Veins that are enlarged, bulging, or twisted (varicose veins).  Skin breakdown or ulcers.  Reddened skin or dark discoloration of skin on the leg between the knee and ankle.  Brown, smooth, tight, and painful skin just above the ankle, usually on the inside of the leg (lipodermatosclerosis).  Swelling of the legs. How is this diagnosed? This condition may be diagnosed based on:  Your medical history.  A physical exam.   Tests, such as: ? A procedure that creates an image of a blood vessel and nearby organs and provides information about blood flow through the blood vessel (duplex ultrasound). ? A procedure that tests blood flow (plethysmography). ? A procedure that looks at the veins using X-ray and dye (venogram). How is this treated? The goals of treatment are to help you return to an active life and to minimize pain or disability. Treatment depends on the severity of your condition, and it may include:  Wearing compression stockings. These can help relieve symptoms and help prevent your condition from getting worse. However, they do not cure the condition.  Sclerotherapy. This procedure involves an injection of a solution that shrinks damaged veins.  Surgery. This may involve: ? Removing a diseased vein (vein stripping). ? Cutting off blood flow through the vein (laser ablation surgery). ? Repairing or reconstructing a valve within the affected vein. Follow these instructions at home:      Wear compression stockings as told by your health care provider. These stockings help to prevent blood clots and reduce swelling in your legs.  Take over-the-counter and prescription medicines only as told by your health care provider.  Stay active by exercising, walking, or doing different activities. Ask your health care provider what activities are safe for you and how much exercise you need.  Drink enough fluid to keep your urine pale yellow.  Do not use any products that contain nicotine or tobacco, such as cigarettes, e-cigarettes, and chewing tobacco. If you need help quitting, ask your health care provider.    Keep all follow-up visits as told by your health care provider. This is important. Contact a health care provider if you:  Have redness, swelling, or more pain in the affected area.  See a red streak or line that goes up or down from the affected area.  Have skin breakdown or skin loss in the  affected area, even if the breakdown is small.  Get an injury in the affected area. Get help right away if:  You get an injury and an open wound in the affected area.  You have: ? Severe pain that does not get better with medicine. ? Sudden numbness or weakness in the foot or ankle below the affected area. ? Trouble moving your foot or ankle. ? A fever. ? Worse or persistent symptoms. ? Chest pain. ? Shortness of breath. Summary  Chronic venous insufficiency is a condition where the leg veins cannot effectively pump blood from the legs to the heart.  Chronic venous insufficiency occurs when the vein walls become stretched, weakened, or damaged, or when valves within the vein are damaged.  Treatment depends on how severe your condition is. It often involves wearing compression stockings and may involve having a procedure.  Make sure you stay active by exercising, walking, or doing different activities. Ask your health care provider what activities are safe for you and how much exercise you need. This information is not intended to replace advice given to you by your health care provider. Make sure you discuss any questions you have with your health care provider. Document Released: 03/14/2007 Document Revised: 08/01/2018 Document Reviewed: 08/01/2018 Elsevier Patient Education  2020 [employer]  

## 2019-11-01 NOTE — Progress Notes (Signed)
Referred by:  Robyne Peers, MD [address] [place],  [place] [address]  Reason for referral: bleeding varicose vein  History of Present Illness  Dorothy Williamson is a 58 y.o. (Jan 29, 1961) female who presents with chief complaint: bleeding from a vein in her right lower leg on Thanksgiving Day, 2020.  She states that a vein at her lower right leg "squirted blood" with no injury on Thanksgiving 2020. She could not get the bleeding to stop, called 911, and it stopped by the time the medics arrived. She then saw [facility], but states her insurance would not cover that practice, is seeing [facility]. She has another bleed from the same spot on her leg on 10-24-19, seen in the ED at [facility]. She has been keeping her right lower leg wrapped with an ace wrap, and has had no more bleeding.   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   She has lung sarcoidosis.   DM: yes, states her last A1C was 6.1 in July 2020 Tobacco use: quit in 2015, smoked x 15 years   Past Medical History:  Diagnosis Date  . Chronic knee pain   . Diabetes mellitus without complication ([place])   . GERD (gastroesophageal reflux disease)   . Hypercholesteremia   . Hypertension   . Nerve pain   . Thyroid disease     Past Surgical History:  Procedure Laterality Date  . ABDOMINAL HYSTERECTOMY    . BREAST SURGERY    . CARDIOVASCULAR STRESS TEST Bilateral 2017   normal results  . CARPAL TUNNEL RELEASE    . CHOLECYSTECTOMY    . JOINT REPLACEMENT      Social History   Socioeconomic History  . Marital status: Widowed    Spouse name: Not on file  . Number of children: Not on file  . Years of education: Not on file  . Highest education level: Not on file  Occupational History  . Not on file  Tobacco Use  . Smoking status: Former Research scientist (life sciences)  . Smokeless tobacco: Never Used  Substance and Sexual Activity  . Alcohol use: No  . Drug use: No  . Sexual  activity: Not on file  Other Topics Concern  . Not on file  Social History Narrative  . Not on file   Social Determinants of Health   Financial Resource Strain:   . Difficulty of Paying Living Expenses: Not on file  Food Insecurity:   . Worried About Charity fundraiser in the Last Year: Not on file  . Ran Out of Food in the Last Year: Not on file  Transportation Needs:   . Lack of Transportation (Medical): Not on file  . Lack of Transportation (Non-Medical): Not on file  Physical Activity:   . Days of Exercise per Week: Not on file  . Minutes of Exercise per Session: Not on file  Stress:   . Feeling of Stress : Not on file  Social Connections:   . Frequency of Communication with Friends and Family: Not on file  . Frequency of Social Gatherings with Friends and Family: Not on file  . Attends Religious Services: Not on file  . Active Member of Clubs or Organizations: Not on file  . Attends Archivist Meetings: Not on file  . Marital Status: Not on file  Intimate Partner Violence:   . Fear of Current or Ex-Partner: Not on file  .  Emotionally Abused: Not on file  . Physically Abused: Not on file  . Sexually Abused: Not on file    History reviewed. No pertinent family history.  Current Outpatient Medications on File Prior to Visit  Medication Sig Dispense Refill  . acetaminophen-codeine (TYLENOL #3) 300-30 MG tablet Take 1 tablet by mouth every 6 (six) hours as needed. for pain  2  . amLODipine (NORVASC) 10 MG tablet Take 10 mg by mouth daily.     . Biotin 1 MG CAPS Take 1 capsule by mouth daily.    . butalbital-acetaminophen-caffeine (FIORICET) 50-325-40 MG tablet Take 1-2 tablets by mouth every 8 (eight) hours as needed for headache. 10 tablet 0  . carbidopa-levodopa (SINEMET IR) 25-100 MG tablet Take 2 tablets by mouth at bedtime.     . cetirizine (ZYRTEC) 10 MG tablet Take 10 mg by mouth daily.    . cholecalciferol (VITAMIN D) 1000 units tablet Take 1,000 Units  by mouth daily.    . DULoxetine (CYMBALTA) 60 MG capsule Take 60 mg by mouth daily.     . fluticasone (FLONASE) 50 MCG/ACT nasal spray Place 1 spray into both nostrils daily.     Marland Kitchen gabapentin (NEURONTIN) 300 MG capsule Take 300 mg by mouth 3 (three) times daily.    . hydrochlorothiazide (HYDRODIURIL) 25 MG tablet Take 25 mg by mouth daily.     Marland Kitchen HYDROcodone-acetaminophen (NORCO/VICODIN) 5-325 MG tablet Take 1 tablet by mouth every 6 (six) hours as needed for pain.    Marland Kitchen levothyroxine (SYNTHROID, LEVOTHROID) 137 MCG tablet Take 137 mcg by mouth daily before breakfast.    . magnesium 30 MG tablet Take 30 mg by mouth daily.    . metFORMIN (GLUCOPHAGE) 500 MG tablet Take 500 mg by mouth 2 (two) times daily before a meal.    . Omega-3 Fatty Acids (FISH OIL) 1000 MG CAPS Take 1 capsule by mouth daily.    . pantoprazole (PROTONIX) 20 MG tablet Take 20 mg by mouth daily.    . potassium chloride (K-DUR) 10 MEQ tablet Take 1 tablet by mouth See admin instructions. Patient to take 2 tablets by mouth on Mon, Wed, Fri.    Marland Kitchen POTASSIUM PO Take 1 tablet by mouth daily.    . simvastatin (ZOCOR) 10 MG tablet Take 10 mg by mouth every evening.     . zolpidem (AMBIEN) 10 MG tablet Take 10 mg by mouth at bedtime.      No current facility-administered medications on file prior to visit.    No Known Allergies  REVIEW OF SYSTEMS: Cardiovascular: No chest pain, chest pressure, palpitations, orthopnea, or dyspnea on exertion. No claudication or rest pain,  No history of DVT or phlebitis. Pulmonary: No productive cough, asthma or wheezing. + lung sarcoidosis  Neurologic: No weakness, +paresthesias in right big toe at times, no aphasia, or amaurosis. No dizziness. Hematologic: No bleeding problems or clotting disorders. Musculoskeletal: + joint pain or joint swelling, arthritis pain in all her joints. Gastrointestinal: No blood in stool or hematemesis Genitourinary: No dysuria or hematuria. Psychiatric:: No history  of major depression. Integumentary: No rashes or ulcers other than the bleeding varicosity at her right lower legs that is now a dark macular lesion Constitutional: No fever or chills.  Physical Examination Vitals:   11/01/19 1536  BP: (!) 139/92  Pulse: 97  Resp: 16  Temp: (!) 97.2 F (36.2 C)  TempSrc: Temporal  SpO2: 97%  Weight: 207 lb (93.9 kg)  Height: 5\' 4"  (1.626 m)  Body mass index is 35.53 kg/m.  PHYSICAL EXAMINATION: General: Obese female in NAD  HEENT:  No gross abnormalities Pulmonary: Respirations are non-labored, fair air movement in all fields, no rales, rhonchi, or wheezes Abdomen: Soft and non-tender with normal bowel sounds. Musculoskeletal: There are no major deformities. No edema in legs  Neurologic: No focal weakness or paresthesias are detected, muscle strength in all extremities is 5/5  Skin: There are no ulcer or rashes noted. Dark macular lesion on right lower leg medial aspect, just proximal to medial malleolus (this is where pt states that the bleeding came from) Psychiatric: The patient has normal affect. Cardiovascular: There is a regular rate and rhythm without significant murmur appreciated.  Several small varicosities noted in both legs.   Vascular: Vessel Right Left  Radial Palpable Palpable  Carotid  without bruit  without bruit  Aorta Not palpable N/A  Femoral Palpable Palpable  Popliteal  palpable  palpable  PT Not Palpable Palpable  DP Palpable Palpable   Non-Invasive Vascular Imaging  BLE Venous Duplex (Date: 11/01/2019):  Venous Reflux Times +--------------+------+-----------+------------+--------+ RIGHT         RefluxReflux TimeDiameter cmsComments                Yes                                  +--------------+------+-----------+------------+--------+ CFV            yes   >1 second                      +--------------+------+-----------+------------+--------+ GSV at SFJ     yes    >500 ms      0.671             +--------------+------+-----------+------------+--------+ GSV prox thigh yes    >500 ms     0.383             +--------------+------+-----------+------------+--------+ GSV mid thigh  yes    >500 ms      0.63             +--------------+------+-----------+------------+--------+ GSV dist thigh yes    >500 ms     0.461             +--------------+------+-----------+------------+--------+ GSV at knee    yes    >500 ms     0.324             +--------------+------+-----------+------------+--------+ GSV prox calf                     0.452             +--------------+------+-----------+------------+--------+ SSV Pop Fossa                     0.164             +--------------+------+-----------+------------+--------+ SSV prox calf                     0.183             +--------------+------+-----------+------------+--------+ SSV mid calf                       0.16             +--------------+------+-----------+------------+--------+ AASV prox  0.452             +--------------+------+-----------+------------+--------+ AASV mid       yes    >500 ms      0.36             +--------------+------+-----------+------------+--------+ AASV dist      yes    >500 ms                       +--------------+------+-----------+------------+--------+    +--------------+------+-----------+------------+--------+ LEFT          RefluxReflux TimeDiameter cmsComments                Yes                                  +--------------+------+-----------+------------+--------+ CFV            yes   >1 second                      +--------------+------+-----------+------------+--------+ GSV at SFJ                        0.623             +--------------+------+-----------+------------+--------+ GSV prox thigh yes    >500 ms     0.255              +--------------+------+-----------+------------+--------+ GSV mid thigh  yes    >500 ms     0.362             +--------------+------+-----------+------------+--------+ SSV Pop Fossa                     0.237             +--------------+------+-----------+------------+--------+ SSV prox calf                     0.251             +--------------+------+-----------+------------+--------+ SSV mid calf                      0.315             +--------------+------+-----------+------------+--------+  Summary: Right: 1. No evidence of deep vein thrombosis. 2. No evidence of superficial vein thrombosis. 3. Deep vein reflux in the CFV. 4. Superficial vein reflux in the GSV frm the SFJ to the knee. 5. Superficial vein reflux in the AASV in the mid and distal thigh.  Left:  1. No evidence of deep vein thrombosis. 2. No evidence of superficial vein thrombosis. 3. Deep vein reflux in the CFV. 4. Superficial vein reflux in the GSV proximal and mid thigh.    Medical Decision Making  Aydee Mcnew is a 58 y.o. female who presents with: a history of spontaneous bleeding from a spot just proximal to her medial malleolus; this occurred at her home on Thanksgiving Day 2020, and again on 10-24-19, seen at [facility] ED.  She has not been working since Thanksgiving, voces concern of recurrence of bleeding from her leg. The bleeding did not occur while she was working.  She has been wearing an ace wrap over some gauze at her right lower ankle. She has had no bleeding from her legs since 10-24-19.  She has no peripheral edema. There are several small varicosities in both legs.  I advised her  to walk several times/day, avoid prolonged sitting or standing, elevate her feet above her heart whenever possible, and to wear the thigh high compression hose during the day.   I discussed with the patient the use of 20-30 mm thigh high compression stockings and need for 3 month trial of such.   She was measured for thigh high compression hose, given the measurements, and given printed information re [employer], a discount compression hose outlet in [place].   The patient will follow up in 3 months with Dr. Darrick Penna or Dr[facility] Edilia [facility].  Thank you for allowing [place] to participate in this patient's care.  Charisse March, RN, MSN, FNP-C [facility] Office: [phone]  11/01/2019, 4:01 PM  Clinic MD: Darrick Penna

## 2020-01-30 DIAGNOSIS — F0670 Mild neurocognitive disorder due to known physiological condition without behavioral disturbance: Secondary | ICD-10-CM | POA: Insufficient documentation

## 2020-01-31 ENCOUNTER — Other Ambulatory Visit: Payer: Self-pay

## 2020-01-31 ENCOUNTER — Encounter: Payer: Self-pay | Admitting: Vascular Surgery

## 2020-01-31 ENCOUNTER — Ambulatory Visit: Payer: BC Managed Care – PPO | Admitting: Vascular Surgery

## 2020-01-31 VITALS — BP 137/91 | HR 88 | Temp 97.2°F | Resp 18 | Ht 64.0 in | Wt 200.7 lb

## 2020-01-31 DIAGNOSIS — I83893 Varicose veins of bilateral lower extremities with other complications: Secondary | ICD-10-CM | POA: Diagnosis not present

## 2020-01-31 DIAGNOSIS — I872 Venous insufficiency (chronic) (peripheral): Secondary | ICD-10-CM

## 2020-01-31 NOTE — Progress Notes (Signed)
Patient name: Dorothy Williamson MRN: [id number] DOB: 10-14-1961 Sex: female  REASON FOR VISIT:   Follow-up of venous disease.  HPI:   Dorothy Williamson is a pleasant 59 y.o. female who was seen by the nurse practitioner on 11/01/2019 for a bleeding varicose vein.  This patient had a bleeding episode from a varicose vein above her right medial malleolus on 10/17/2019 and then again on 10/24/2019.  This required a trip to the emergency department and an ambulance ride.  She had significant bleeding related to this.  She does state that she has had previous vein procedures done at [facility] but does not know the details.  It sounds like she may have had sclerotherapy.  She has no previous history of DVT.  She has been wearing her thigh-high compression stockings with a gradient of 20 to 30 mmHg faithfully.  She keeps a Band-Aid over the area of concern to prevent bleeding when she is putting her stockings on and off.  She also has been elevating her legs which helps her symptoms.  She describes aching pain and heaviness in both legs which is aggravated by standing and relieved with elevation.  She works long hours standing and her symptoms are worse at the end of the day.  She takes ibuprofen as needed for pain.  Past Medical History:  Diagnosis Date  . Chronic knee pain   . Diabetes mellitus without complication ([place])   . GERD (gastroesophageal reflux disease)   . Hypercholesteremia   . Hypertension   . Nerve pain   . Thyroid disease     History reviewed. No pertinent family history.  SOCIAL HISTORY: Social History   Tobacco Use  . Smoking status: Former Research scientist (life sciences)  . Smokeless tobacco: Never Used  Substance Use Topics  . Alcohol use: No    No Known Allergies  Current Outpatient Medications  Medication Sig Dispense Refill  . acetaminophen-codeine (TYLENOL #3) 300-30 MG tablet Take 1 tablet by mouth every 6 (six) hours as needed. for pain  2  . amLODipine (NORVASC) 10 MG tablet Take  10 mg by mouth daily.     . Biotin 1 MG CAPS Take 1 capsule by mouth daily.    . butalbital-acetaminophen-caffeine (FIORICET) 50-325-40 MG tablet Take 1-2 tablets by mouth every 8 (eight) hours as needed for headache. 10 tablet 0  . carbidopa-levodopa (SINEMET IR) 25-100 MG tablet Take 2 tablets by mouth at bedtime.     . cetirizine (ZYRTEC) 10 MG tablet Take 10 mg by mouth daily.    . cholecalciferol (VITAMIN D) 1000 units tablet Take 1,000 Units by mouth daily.    . DULoxetine (CYMBALTA) 60 MG capsule Take 60 mg by mouth daily.     . fluticasone (FLONASE) 50 MCG/ACT nasal spray Place 1 spray into both nostrils daily.     Marland Kitchen gabapentin (NEURONTIN) 300 MG capsule Take 300 mg by mouth 3 (three) times daily.    . hydrochlorothiazide (HYDRODIURIL) 25 MG tablet Take 25 mg by mouth daily.     Marland Kitchen HYDROcodone-acetaminophen (NORCO/VICODIN) 5-325 MG tablet Take 1 tablet by mouth every 6 (six) hours as needed for pain.    Marland Kitchen levothyroxine (SYNTHROID, LEVOTHROID) 137 MCG tablet Take 137 mcg by mouth daily before breakfast.    . magnesium 30 MG tablet Take 30 mg by mouth daily.    . metFORMIN (GLUCOPHAGE) 500 MG tablet Take 500 mg by mouth 2 (two) times daily before a meal.    . Omega-3 Fatty Acids (FISH  OIL) 1000 MG CAPS Take 1 capsule by mouth daily.    . pantoprazole (PROTONIX) 20 MG tablet Take 20 mg by mouth daily.    . potassium chloride (K-DUR) 10 MEQ tablet Take 1 tablet by mouth See admin instructions. Patient to take 2 tablets by mouth on Mon, Wed, Fri.    Marland Kitchen POTASSIUM PO Take 1 tablet by mouth daily.    . simvastatin (ZOCOR) 10 MG tablet Take 10 mg by mouth every evening.     . zolpidem (AMBIEN) 10 MG tablet Take 10 mg by mouth at bedtime.      No current facility-administered medications for this visit.    REVIEW OF SYSTEMS:  [X]  denotes positive finding, [ ]  denotes negative finding Cardiac  Comments:  Chest pain or chest pressure:    Shortness of breath upon exertion:    Short of breath  when lying flat:    Irregular heart rhythm:        Vascular    Pain in calf, thigh, or hip brought on by ambulation:    Pain in feet at night that wakes you up from your sleep:     Blood clot in your veins:    Leg swelling:  x       Pulmonary    Oxygen at home:    Productive cough:     Wheezing:         Neurologic    Sudden weakness in arms or legs:     Sudden numbness in arms or legs:     Sudden onset of difficulty speaking or slurred speech:    Temporary loss of vision in one eye:     Problems with dizziness:         Gastrointestinal    Blood in stool:     Vomited blood:         Genitourinary    Burning when urinating:     Blood in urine:        Psychiatric    Major depression:         Hematologic    Bleeding problems:    Problems with blood clotting too easily:        Skin    Rashes or ulcers:        Constitutional    Fever or chills:     PHYSICAL EXAM:   Vitals:   01/31/20 1540  BP: (!) 137/91  Pulse: 88  Resp: 18  Temp: (!) 97.2 F (36.2 C)  TempSrc: Temporal  SpO2: 98%  Weight: 200 lb 11.2 oz (91 kg)  Height: 5\' 4"  (1.626 m)   Body mass index is 34.45 kg/m.  GENERAL: The patient is a well-nourished female, in no acute distress. The vital signs are documented above. CARDIAC: There is a regular rate and rhythm.  VASCULAR: I do not detect carotid bruits. She has palpable pedal pulses. She has some spider veins and telangiectasias in her lower legs bilaterally. She has a small eschar over the area that bled twice as documented in the photograph below.     I did look at the vein myself with the SonoSite and the great saphenous vein on the right has reflux from the groin to the knee.  The vein exits the fascia at the junction of the proximal and middle thigh.  Thus we would only ablate the vein in the proximal thigh.  There is an anterior accessory saphenous vein but there is no reflux proximally and distally the vein  is somewhat  tortuous. PULMONARY: There is good air exchange bilaterally without wheezing or rales. ABDOMEN: Soft and non-tender with normal pitched bowel sounds.  MUSCULOSKELETAL: There are no major deformities or cyanosis. NEUROLOGIC: No focal weakness or paresthesias are detected. SKIN: There are no ulcers or rashes noted. PSYCHIATRIC: The patient has a normal affect.  DATA:    VENOUS DUPLEX: I reviewed the venous duplex scan that was done on 11/01/2019.  On the right side, which is the side of concern, there was no evidence of DVT or superficial venous thrombosis.  There was deep venous reflux involving the common femoral vein.  There was superficial venous reflux involving the great saphenous vein from the saphenofemoral junction to the knee.  The vein was dilated up to 0.63 cm in proximal thigh.  There was also reflux in the mid and distal anterior accessory saphenous vein on the right.  On the left side there was no evidence of DVT or superficial venous thrombosis.  There was deep venous reflux involving the common femoral vein.  There was superficial venous reflux in the left great saphenous vein in the proximal and mid thigh.  The saphenofemoral junction was competent.  MEDICAL ISSUES:   CHRONIC VENOUS INSUFFICIENCY: This patient has had bleeding 2 episodes from some spider veins adjacent to her right medial malleolus.  Given the risk for continued bleeding I have recommended laser ablation of the proximal right great saphenous vein.  Below that the vein becomes superficial.  In addition I would likely make 1-2 stabs adjacent to the area of concern and try to perform phlebectomy of the underlying veins which can be seen by duplex.  In addition she would likely require a unit of sclerotherapy in this area.  All of this is to lower her risk of future bleeding episodes.  I have discussed the indications for endovenous laser ablation of the right GSV, that is to lower the pressure in the veins and  potentially help relieve the symptoms from venous hypertension. I have also discussed alternative options including conservative treatment with leg elevation, compression therapy, exercise, avoiding prolonged sitting and standing, and weight management. I have discussed the potential complications of the procedure, including, but not limited to: bleeding, bruising, leg swelling, nerve injury, skin burns, significant pain from phlebitis, deep venous thrombosis, or failure of the vein to close.  I have also explained that venous insufficiency is a chronic disease, and that the patient is at risk for recurrent varicose veins in the future.  All of the patient's questions were encouraged and answered. They are agreeable to proceed.   I have also discussed with her the importance of intermittent leg elevation and the proper positioning for this.  I encouraged her to continue to wear her compression stockings.  I encouraged her to avoid prolonged sitting and standing and at work to try to move around as much as possible.  We also discussed the importance of exercise.  I have reviewed with her how to address any future bleeding episodes.  She knows to lay down and elevate her leg above her heart.  She also knows to hold direct pressure on the vein that is bleeding rather than piling on dressings and trying to stop the bleeding that way.   Waverly Ferrari [facility] [phone]

## 2020-02-21 ENCOUNTER — Encounter: Payer: Self-pay | Admitting: Vascular Surgery

## 2020-02-21 ENCOUNTER — Other Ambulatory Visit: Payer: Self-pay | Admitting: *Deleted

## 2020-02-21 DIAGNOSIS — I83811 Varicose veins of right lower extremities with pain: Secondary | ICD-10-CM

## 2020-03-04 DIAGNOSIS — F411 Generalized anxiety disorder: Secondary | ICD-10-CM | POA: Insufficient documentation

## 2020-03-19 ENCOUNTER — Other Ambulatory Visit: Payer: Self-pay | Admitting: *Deleted

## 2020-03-19 MED ORDER — LORAZEPAM 1 MG PO TABS: ORAL_TABLET | ORAL | 0 refills | Status: DC

## 2020-03-20 ENCOUNTER — Other Ambulatory Visit: Payer: Self-pay

## 2020-03-20 ENCOUNTER — Ambulatory Visit (INDEPENDENT_AMBULATORY_CARE_PROVIDER_SITE_OTHER): Payer: BC Managed Care – PPO | Admitting: Vascular Surgery

## 2020-03-20 ENCOUNTER — Encounter: Payer: Self-pay | Admitting: Vascular Surgery

## 2020-03-20 VITALS — BP 151/103 | HR 82 | Temp 97.3°F | Resp 16 | Ht 62.5 in | Wt 200.7 lb

## 2020-03-20 DIAGNOSIS — I83811 Varicose veins of right lower extremities with pain: Secondary | ICD-10-CM | POA: Diagnosis not present

## 2020-03-20 HISTORY — PX: ENDOVENOUS ABLATION SAPHENOUS VEIN W/ LASER: SUR449

## 2020-03-20 NOTE — Progress Notes (Signed)
Patient name: Dorothy Williamson MRN: [id number] DOB: 07/21/1961 Sex: female  REASON FOR VISIT: For laser ablation of the right great saphenous vein  HPI: Dorothy Williamson is a 59 y.o. female who was originally seen by the nurse practitioner in December of last year with bleeding varicose veins.  The patient had an episode of bleeding from a varicose vein above her right medial malleolus in November 2020.  This happened again in December.  This required an emergency room visit an ambulance ride with reportedly significant bleeding.  She may have had sclerotherapy at [facility] but we do not have these records.  She has been wearing thigh-high compression stockings with a gradient of 20-30 and elevating her legs.  On my exam she had a small eschar overlying the area which had bled twice.  I looked at her right great saphenous vein myself with the SonoSite.  The vein exits the fascia at the junction of the proximal and middle thigh.  Thus we would only ablate the vein in the proximal thigh.  There was an anterior sensory saphenous vein but there was no reflux proximally and distally the vein was tortuous.  I recommended laser ablation of the proximal right great saphenous vein with 1-2 stabs adjacent to the area of concern to try to perform stab phlebectomy of the underlying veins.  She would also require a unit of sclerotherapy in this area subsequently.  Current Outpatient Medications  Medication Sig Dispense Refill  . acetaminophen-codeine (TYLENOL #3) 300-30 MG tablet Take 1 tablet by mouth every 6 (six) hours as needed. for pain  2  . amLODipine (NORVASC) 10 MG tablet Take 10 mg by mouth daily.     . Biotin 1 MG CAPS Take 1 capsule by mouth daily.    . butalbital-acetaminophen-caffeine (FIORICET) 50-325-40 MG tablet Take 1-2 tablets by mouth every 8 (eight) hours as needed for headache. 10 tablet 0  . carbidopa-levodopa (SINEMET IR) 25-100 MG tablet Take 2 tablets by mouth at bedtime.     .  cetirizine (ZYRTEC) 10 MG tablet Take 10 mg by mouth daily.    . cholecalciferol (VITAMIN D) 1000 units tablet Take 1,000 Units by mouth daily.    . DULoxetine (CYMBALTA) 60 MG capsule Take 60 mg by mouth daily.     . fluticasone (FLONASE) 50 MCG/ACT nasal spray Place 1 spray into both nostrils daily.     Marland Kitchen gabapentin (NEURONTIN) 300 MG capsule Take 300 mg by mouth 3 (three) times daily.    . hydrochlorothiazide (HYDRODIURIL) 25 MG tablet Take 25 mg by mouth daily.     Marland Kitchen HYDROcodone-acetaminophen (NORCO/VICODIN) 5-325 MG tablet Take 1 tablet by mouth every 6 (six) hours as needed for pain.    Marland Kitchen levothyroxine (SYNTHROID, LEVOTHROID) 137 MCG tablet Take 137 mcg by mouth daily before breakfast.    . LORazepam (ATIVAN) 1 MG tablet Take 1 tablet prior to leaving home on day of procedure.  Bring second tablet with you to office on procedure day. 2 tablet 0  . magnesium 30 MG tablet Take 30 mg by mouth daily.    . metFORMIN (GLUCOPHAGE) 500 MG tablet Take 500 mg by mouth 2 (two) times daily before a meal.    . Omega-3 Fatty Acids (FISH OIL) 1000 MG CAPS Take 1 capsule by mouth daily.    . pantoprazole (PROTONIX) 20 MG tablet Take 20 mg by mouth daily.    . potassium chloride (K-DUR) 10 MEQ tablet Take 1 tablet by mouth See  admin instructions. Patient to take 2 tablets by mouth on Mon, Wed, Fri.    Marland Kitchen POTASSIUM PO Take 1 tablet by mouth daily.    . simvastatin (ZOCOR) 10 MG tablet Take 10 mg by mouth every evening.     . zolpidem (AMBIEN) 10 MG tablet Take 10 mg by mouth at bedtime.      No current facility-administered medications for this visit.    PHYSICAL EXAM: Vitals:   03/20/20 0833  BP: (!) 151/103  Pulse: 82  Resp: 16  Temp: (!) 97.3 F (36.3 C)  TempSrc: Temporal  SpO2: 99%  Weight: 200 lb 11.2 oz (91 kg)  Height: 5' 2.5" (1.588 m)    PROCEDURE: Endovenous laser ablation right great saphenous vein.    TECHNIQUE: The patient was taken to the exam room and placed supine.  I looked  at the right great saphenous vein myself with the SonoSite and felt that I could cannulate it at the junction of the proximal third and middle third of the thigh just above where it exits the fascia.  The right leg was prepped and draped in usual sterile fashion.  After the skin was anesthetized with 1% lidocaine the right great saphenous vein was cannulated at the junction of the middle and proximal third of the thigh with a micropuncture needle under ultrasound guidance.  The micropuncture sheath was introduced over wire.  I then advanced the J-wire to just below the saphenofemoral junction.  The dilator and sheath were advanced over the wire and then the wire and dilator were removed.  I position the end of the sheath just below the takeoff of the superficial epigastric vein which was approximately 1.7 cm distal to the saphenofemoral junction.  The laser fiber was positioned at the end of the sheath and then the sheath retracted.  Tumescent anesthesia was then administered circumferentially around the vein over the entire length of the vein.  The patient was then placed in Trendelenburg.  Laser ablation was performed of the right great saphenous vein in the thigh.  19 cm of vein was treated 854 J of energy was used.  Next the area where I had marked some dilated veins adjacent to the area that had bled was anesthetized with tumescent anesthesia and 2 small stab incisions were made and stab phlebectomies performed here.  There was good hemostasis.  Sterile dressing was applied.  Patient tolerated the procedure well.  She will return in 1 week for follow-up duplex.   Deitra Mayo [facility] [place] [phone]

## 2020-03-20 NOTE — Progress Notes (Signed)
     Laser Ablation Procedure    Date: 03/20/2020   Mekala Winger DOB:September 02, 1961  Consent signed: Yes    Surgeon: Cari Caraway MD   Procedure: Laser Ablation: right Greater Saphenous Vein (proximal segment)   BP (!) 151/103 (BP Location: Left Arm, Patient Position: Sitting, Cuff Size: Large)   Pulse 82   Temp (!) 97.3 F (36.3 C) (Temporal)   Resp 16   Ht 5' 2.5" (1.588 m)   Wt 200 lb 11.2 oz (91 kg)   SpO2 99%   BMI 36.12 kg/m   Tumescent Anesthesia: 400 cc 0.9% NaCl with 50 cc Lidocaine HCL 1%  and 15 cc 8.4% NaHCO3  Local Anesthesia: 3 cc Lidocaine HCL and NaHCO3 (ratio 2:1)  7 watts continuous mode     Total energy: 854 Joules   Treatment length 19 cm Total Time 122 sec.  Laser Fiber Ref. # [id number]     Lot # [id number]   Stab Phlebectomy: 2 Sites: Calf  Patient tolerated procedure well  Notes: Patient wore face mask.  All staff members wore facial masks and facial shields/goggles.  Ativan 1 mg taken at 7:45 AM on 03-20-2020.   Ativan taken at 8:20 AM on 03-20-2020.   Description of Procedure:  After marking the course of the secondary varicosities, the patient was placed on the operating table in the supine position, and the right leg was prepped and draped in sterile fashion.   Local anesthetic was administered and under ultrasound guidance the saphenous vein was accessed with a micro needle and guide wire; then the mirco puncture sheath was placed.  A guide wire was inserted saphenofemoral junction , followed by a 5 french sheath.  The position of the sheath and then the laser fiber below the junction was confirmed using the ultrasound.  Tumescent anesthesia was administered along the course of the saphenous vein using ultrasound guidance. The patient was placed in Trendelenburg position and protective laser glasses were placed on patient and staff, and the laser was fired at 7 watts continuous mode for a total of 854 joules.   For stab phlebectomies, local  anesthetic was administered at the previously marked varicosities, and tumescent anesthesia was administered around the vessels.  Two stab wounds were made using the tip of an 11 blade. And using the vein hook, the phlebectomies were performed using a hemostat to avulse the varicosities.  Adequate hemostasis was achieved.     Steri strips were applied to the stab wounds and ABD pads and thigh high compression stockings were applied.  Ace wrap bandages were applied over the phlebectomy sites and at the top of the saphenofemoral junction. Blood loss was less than 15 cc.  Discharge instructions reviewed with patient and hardcopy of discharge instructions given to patient to take home. The patient ambulated out of the operating room having tolerated the procedure well.

## 2020-03-24 ENCOUNTER — Other Ambulatory Visit: Payer: Self-pay | Admitting: *Deleted

## 2020-03-24 DIAGNOSIS — I83811 Varicose veins of right lower extremities with pain: Secondary | ICD-10-CM

## 2020-03-27 ENCOUNTER — Encounter: Payer: Self-pay | Admitting: Vascular Surgery

## 2020-03-27 ENCOUNTER — Ambulatory Visit (HOSPITAL_COMMUNITY)
Admission: RE | Admit: 2020-03-27 | Discharge: 2020-03-27 | Disposition: A | Discharge status: Home / Self Care | Payer: BC Managed Care – PPO | Source: Ambulatory Visit | Attending: Vascular Surgery | Admitting: Vascular Surgery

## 2020-03-27 ENCOUNTER — Other Ambulatory Visit: Payer: Self-pay

## 2020-03-27 ENCOUNTER — Ambulatory Visit (INDEPENDENT_AMBULATORY_CARE_PROVIDER_SITE_OTHER): Payer: BC Managed Care – PPO | Admitting: Vascular Surgery

## 2020-03-27 VITALS — BP 139/82 | HR 83 | Temp 97.5°F | Resp 14 | Ht 62.4 in | Wt 204.5 lb

## 2020-03-27 DIAGNOSIS — Z48812 Encounter for surgical aftercare following surgery on the circulatory system: Secondary | ICD-10-CM

## 2020-03-27 DIAGNOSIS — I83811 Varicose veins of right lower extremities with pain: Secondary | ICD-10-CM

## 2020-03-27 DIAGNOSIS — I8393 Asymptomatic varicose veins of bilateral lower extremities: Secondary | ICD-10-CM

## 2020-03-27 NOTE — Progress Notes (Signed)
Patient name: Dorothy Williamson MRN: [id number] DOB: 1960/12/21 Sex: female  REASON FOR VISIT: Follow-up after endovenous laser ablation of the right great saphenous vein  HPI: Dorothy Williamson is a 59 y.o. female who had failed conservative treatment of her venous disease.  She underwent endovenous laser ablation of the right great saphenous vein on 03/20/2020 to the proximal thigh.  She comes in for follow-up visit.  Overall she is doing well.  She has no specific complaints.  She had some mild bruising which has resolved.  She has been wearing her thigh-high stockings.  She denies any chest pain or shortness of breath.   Current Outpatient Medications  Medication Sig Dispense Refill  . acetaminophen-codeine (TYLENOL #3) 300-30 MG tablet Take 1 tablet by mouth every 6 (six) hours as needed. for pain  2  . amLODipine (NORVASC) 10 MG tablet Take 10 mg by mouth daily.     . Biotin 1 MG CAPS Take 1 capsule by mouth daily.    . butalbital-acetaminophen-caffeine (FIORICET) 50-325-40 MG tablet Take 1-2 tablets by mouth every 8 (eight) hours as needed for headache. 10 tablet 0  . carbidopa-levodopa (SINEMET IR) 25-100 MG tablet Take 2 tablets by mouth at bedtime.     . cetirizine (ZYRTEC) 10 MG tablet Take 10 mg by mouth daily.    . cholecalciferol (VITAMIN D) 1000 units tablet Take 1,000 Units by mouth daily.    . DULoxetine (CYMBALTA) 60 MG capsule Take 60 mg by mouth daily.     . fluticasone (FLONASE) 50 MCG/ACT nasal spray Place 1 spray into both nostrils daily.     Marland Kitchen gabapentin (NEURONTIN) 300 MG capsule Take 300 mg by mouth 3 (three) times daily.    . hydrochlorothiazide (HYDRODIURIL) 25 MG tablet Take 25 mg by mouth daily.     Marland Kitchen HYDROcodone-acetaminophen (NORCO/VICODIN) 5-325 MG tablet Take 1 tablet by mouth every 6 (six) hours as needed for pain.    Marland Kitchen levothyroxine (SYNTHROID, LEVOTHROID) 137 MCG tablet Take 137 mcg by mouth daily before breakfast.    . LORazepam (ATIVAN) 1 MG tablet Take 1  tablet prior to leaving home on day of procedure.  Bring second tablet with you to office on procedure day. 2 tablet 0  . magnesium 30 MG tablet Take 30 mg by mouth daily.    . metFORMIN (GLUCOPHAGE) 500 MG tablet Take 500 mg by mouth 2 (two) times daily before a meal.    . Omega-3 Fatty Acids (FISH OIL) 1000 MG CAPS Take 1 capsule by mouth daily.    . pantoprazole (PROTONIX) 20 MG tablet Take 20 mg by mouth daily.    . potassium chloride (K-DUR) 10 MEQ tablet Take 1 tablet by mouth See admin instructions. Patient to take 2 tablets by mouth on Mon, Wed, Fri.    Marland Kitchen POTASSIUM PO Take 1 tablet by mouth daily.    . simvastatin (ZOCOR) 10 MG tablet Take 10 mg by mouth every evening.     . zolpidem (AMBIEN) 10 MG tablet Take 10 mg by mouth at bedtime.      No current facility-administered medications for this visit.   REVIEW OF SYSTEMS: Valu.Nieves ] denotes positive finding; [  ] denotes negative finding  CARDIOVASCULAR:  [ ]  chest pain   [ ]  dyspnea on exertion  [ ]  leg swelling  CONSTITUTIONAL:  [ ]  fever   [ ]  chills  PHYSICAL EXAM: Vitals:   03/27/20 1028  BP: 139/82  Pulse: 83  Resp: 14  Temp: (!) 97.5 F (36.4 C)  TempSrc: Temporal  SpO2: 98%  Weight: 204 lb 8 oz (92.8 kg)  Height: 5' 2.4" (1.585 m)   GENERAL: The patient is a well-nourished female, in no acute distress. The vital signs are documented above. CARDIOVASCULAR: There is a regular rate and rhythm. PULMONARY: There is good air exchange bilaterally without wheezing or rales. VASCULAR: Patient has no significant right leg swelling.  The area where she has bled is starting to heal it looks like.  DATA:  VENOUS DUPLEX: I have independently interpreted her venous duplex scan today.  This shows no evidence of DVT in the right lower extremity.  The right great saphenous vein is closed from just beyond the saphenofemoral junction to the proximal to mid thigh.  MEDICAL ISSUES:  S/P ENDOVENOUS LASER ABLATION RIGHT GREAT SAPHENOUS  VEIN: Patient is doing well status post laser ablation of the right great saphenous vein.  She will wear her thigh-high stockings for 1 more week and then at that point I think it would be more practical to wear knee-high stockings with a gradient of 15 to 20 mmHg.  She would like to consider sclerotherapy and we will arrange this with Cherene Julian, RN.  Waverly Ferrari [facility] [place] [phone]

## 2020-06-10 ENCOUNTER — Other Ambulatory Visit: Payer: Self-pay | Admitting: *Deleted

## 2020-06-10 ENCOUNTER — Other Ambulatory Visit: Payer: Self-pay

## 2020-06-10 ENCOUNTER — Telehealth: Payer: Self-pay | Admitting: *Deleted

## 2020-06-10 DIAGNOSIS — I83891 Varicose veins of right lower extremities with other complications: Secondary | ICD-10-CM

## 2020-06-10 NOTE — Telephone Encounter (Signed)
Spoke with Dorothy Williamson at 1150 AM today.  She states she experienced a bleed from a varicosity over her right ankle Friday night (06-06-2020) after bathing.  Dorothy Williamson states she was able to get the bleeding stopped and it has not re occurred. Dorothy Williamson is s/p endovenous laser ablation right proximal segment of R GSV on 03-20-2020 by Cari Caraway MD.  Scheduled and left detailed telephone message for Dorothy Williamson for venous reflux (right leg, order in Epic) and VV FU with Dr. Edilia Bo on 06-19-2020 at 9:00/10:20 AM.

## 2020-06-19 ENCOUNTER — Ambulatory Visit: Payer: BC Managed Care – PPO | Admitting: Vascular Surgery

## 2020-06-19 ENCOUNTER — Encounter: Payer: Self-pay | Admitting: Vascular Surgery

## 2020-06-19 ENCOUNTER — Ambulatory Visit (HOSPITAL_COMMUNITY)
Admission: RE | Admit: 2020-06-19 | Discharge: 2020-06-19 | Disposition: A | Discharge status: Home / Self Care | Payer: BC Managed Care – PPO | Source: Ambulatory Visit | Attending: Vascular Surgery | Admitting: Vascular Surgery

## 2020-06-19 ENCOUNTER — Other Ambulatory Visit: Payer: Self-pay

## 2020-06-19 VITALS — BP 170/97 | HR 70 | Temp 97.6°F | Resp 16 | Ht 63.0 in | Wt 206.0 lb

## 2020-06-19 DIAGNOSIS — I83891 Varicose veins of right lower extremities with other complications: Secondary | ICD-10-CM | POA: Insufficient documentation

## 2020-06-19 DIAGNOSIS — I872 Venous insufficiency (chronic) (peripheral): Secondary | ICD-10-CM

## 2020-06-19 NOTE — Progress Notes (Signed)
Patient name: Dorothy Williamson MRN: [id number] DOB: 02/08/1961 Sex: female  REASON FOR VISIT:   Bleeding from right ankle varicosity.  HPI:   Dorothy Williamson is a pleasant 59 y.o. female who was seen in our office by the nurse practitioner on 11/01/2019 with a bleeding varicose vein.  This was above her right medial malleolus in November 2020.  This bled again in December 2020.  I saw her for the first time on 01/31/2020.  She had chronic venous insufficiency and given her continued problems with bleeding I recommended laser ablation of the right great saphenous vein in order to lower the venous pressure.  On 03/20/2020 I performed a laser ablation of the right great saphenous vein in the thigh.  I had marked some dilated veins adjacent to the area that it bled into a small stab incisions were made and stab phlebectomies were performed there.  She had another bleeding episode in July and comes in to have this evaluated.  She has had no significant pain or swelling in the leg.  Current Outpatient Medications  Medication Sig Dispense Refill  . amLODipine (NORVASC) 10 MG tablet Take 10 mg by mouth daily.     . Biotin 1 MG CAPS Take 1 capsule by mouth daily.    . carbidopa-levodopa (SINEMET IR) 25-100 MG tablet Take 2 tablets by mouth at bedtime.     . cetirizine (ZYRTEC) 10 MG tablet Take 10 mg by mouth daily.    . cholecalciferol (VITAMIN D) 1000 units tablet Take 1,000 Units by mouth daily.    . DULoxetine (CYMBALTA) 60 MG capsule Take 60 mg by mouth daily.     . fluticasone (FLONASE) 50 MCG/ACT nasal spray Place 1 spray into both nostrils daily.     Marland Kitchen gabapentin (NEURONTIN) 300 MG capsule Take 300 mg by mouth 3 (three) times daily.    . hydrochlorothiazide (HYDRODIURIL) 25 MG tablet Take 25 mg by mouth daily.     Marland Kitchen HYDROcodone-acetaminophen (NORCO/VICODIN) 5-325 MG tablet Take 1 tablet by mouth every 6 (six) hours as needed for pain.    Marland Kitchen levothyroxine (SYNTHROID, LEVOTHROID) 137 MCG tablet  Take 137 mcg by mouth daily before breakfast.    . LORazepam (ATIVAN) 1 MG tablet Take 1 tablet prior to leaving home on day of procedure.  Bring second tablet with you to office on procedure day. 2 tablet 0  . magnesium 30 MG tablet Take 30 mg by mouth daily.    . metFORMIN (GLUCOPHAGE) 500 MG tablet Take 500 mg by mouth 2 (two) times daily before a meal.    . Omega-3 Fatty Acids (FISH OIL) 1000 MG CAPS Take 1 capsule by mouth daily.    . pantoprazole (PROTONIX) 20 MG tablet Take 20 mg by mouth daily.    . potassium chloride (K-DUR) 10 MEQ tablet Take 1 tablet by mouth See admin instructions. Patient to take 2 tablets by mouth on Mon, Wed, Fri.    Marland Kitchen POTASSIUM PO Take 1 tablet by mouth daily.    . simvastatin (ZOCOR) 10 MG tablet Take 10 mg by mouth every evening.     . zolpidem (AMBIEN) 10 MG tablet Take 10 mg by mouth at bedtime.     Marland Kitchen acetaminophen-codeine (TYLENOL #3) 300-30 MG tablet Take 1 tablet by mouth every 6 (six) hours as needed. for pain (Patient not taking: Reported on 06/19/2020)  2   No current facility-administered medications for this visit.    REVIEW OF SYSTEMS:  [X]  denotes positive  finding, [ ]  denotes negative finding Vascular    Leg swelling    Cardiac    Chest pain or chest pressure:    Shortness of breath upon exertion:    Short of breath when lying flat:    Irregular heart rhythm:    Constitutional    Fever or chills:     PHYSICAL EXAM:   Vitals:   06/19/20 1029  BP: (!) 170/97  Pulse: 70  Resp: 16  Temp: 97.6 F (36.4 C)  TempSrc: Temporal  SpO2: 99%  Weight: (!) 93.4 kg  Height: 5\' 3"  (1.6 m)    GENERAL: The patient is a well-nourished female, in no acute distress. The vital signs are documented above. CARDIOVASCULAR: There is a regular rate and rhythm. PULMONARY: There is good air exchange bilaterally without wheezing or rales. VASCULAR: The eschar where she has previously bled is unchanged compared with previous photographs.  I did look at her  anterior sensory saphenous vein myself with the SonoSite and this is more dilated with reflux proximally.  However I do not think this is contributing to the higher venous pressure at the ankle.  There are multiple varicose veins adjacent to this area that can be seen on duplex and one perforator above this area. She has palpable pedal pulses.  DATA:   VENOUS DUPLEX:  I have independently interpreted her venous duplex scan today.  This was of the right lower extremity only.  There is no evidence of DVT or superficial venous thrombosis.  The right great saphenous vein has been successfully ablated.  There is no reflux in the small saphenous vein.  There is noted to be reflux in the anterior sensory saphenous vein which is dilated 2.58 cm.  There is also a perforator noted in the mid calf just above the area that bled.  MEDICAL ISSUES:   BLEEDING FROM VARICOSE VEIN ABOVE THE RIGHT MEDIAL MALLEOLUS: This patient has had a recurrent bleeding episode from the same area above her right medial malleolus.  I have recommended sclerotherapy of the adjacent veins thinking we will hopefully be able to also feel some of the deeper veins.  If this is not successful she would require ligation of the small perforator which would have to be done surgically.  Given her lipodermatosclerosis she would be at some risk for wound healing problems if we have to do this surgically.  We will arrange sclerotherapy in the near future.  06/21/20 [facility] [phone]

## 2020-06-20 ENCOUNTER — Ambulatory Visit (INDEPENDENT_AMBULATORY_CARE_PROVIDER_SITE_OTHER): Payer: Self-pay

## 2020-06-20 DIAGNOSIS — I8393 Asymptomatic varicose veins of bilateral lower extremities: Secondary | ICD-10-CM

## 2020-06-20 NOTE — Progress Notes (Signed)
Treated pt's R lower leg reticular vein with Asclera 1%, administered with a 27g butterfly.  Patient received a total of 2 mL. This area had previously bled a couple times. Pt tolerated well. Provided post procedure care instructions on both handout and verbally. Will follow PRN.   Photos: Yes.    Compression stockings applied: Yes.

## 2020-06-25 ENCOUNTER — Telehealth: Payer: Self-pay | Admitting: *Deleted

## 2020-06-25 NOTE — Telephone Encounter (Signed)
Returning Dorothy Williamson telephone voice message regarding bleeding from right lower reticular vein.  Dorothy Williamson states this is the second time post laser ablation of right greater saphenous vein that this area has bled. She states the bleeding started when she was showering.  Dorothy Williamson is s/p endovenous laser ablation of right greater saphenous vein by Dorothy Ferrari MD on 03-20-2020. Sclerotherapy of right reticular vein (that bled) was done by Dorothy Williamson on 06-20-2020 after being seen in office by Dorothy Williamson on 06-19-2020. Discussed this bleeding episode with Dorothy Williamson and he reviewed Dorothy Williamson medical record. Dorothy Williamson recommends excision of ulcer and ligation of perforator to be done surgically out-patient at [facility]. Discussed this option with Dorothy Williamson and she stated she wanted to move forward with prior authorization with her insurance carrier for the outpatient surgical procedure.  I will notify Dorothy Williamson when I receive a decision from her insurance company.

## 2020-06-26 ENCOUNTER — Telehealth: Payer: Self-pay | Admitting: Vascular Surgery

## 2020-06-26 NOTE — Telephone Encounter (Signed)
This patient has a complicated history.  She was seen by the nurse practitioner on 11/01/2019 with a bleeding varicose vein above the right medial malleolus.  This bled on 10/17/2019 and then again on 10/24/2019.  She required a trip to the emergency department in the ambulance.  She was found to have significant reflux in the right great saphenous vein and I felt that laser ablation of the right great saphenous vein would lower the venous pressure and lower her risk of bleeding.  On 03/20/2020 the patient underwent laser ablation of the right great saphenous vein.  In addition she had some dilated veins adjacent to the area that had bled and I performed 2 stab phlebectomies here.  On her follow-up duplex on 03/27/2020 the patient had no evidence of DVT in the right lower extremity.  The right great saphenous vein had been closed from the saphenofemoral junction to the mid thigh.  Subsequent to that she had sclerotherapy.  Patient had a follow-up duplex on 06/19/2020 which showed the right great saphenous vein remains closed.  There was no reflux in the small saphenous vein.  There was reflux in an anterior sensory saphenous vein which was dilated.  There was also a perforator vein noted in the mid calf just above the area that bled.  Yesterday she had a bleeding episode from the same spot above her medial malleolus.  Given that she had a recurrent bleeding episode I think the most definitive option at this point would be excision of the varicosity that is bleeding with ligation of the underlying perforator.  I think the main risk with this will be wound healing given her lipodermatosclerosis.  I have discussed all of this with her over the phone and she is agreeable to proceed.  We will try to arrange this in the near future.  Waverly Ferrari, MD Office: [phone]

## 2020-07-07 ENCOUNTER — Encounter: Payer: Self-pay | Admitting: Vascular Surgery

## 2020-07-25 ENCOUNTER — Telehealth: Payer: Self-pay | Admitting: *Deleted

## 2020-07-25 NOTE — Telephone Encounter (Signed)
Spoke with pt regarding concerns for out of pocket costs for upcoming procedure. Advised her to contact her insurance company for more information. She will call if she decides she wants to proceed with scheduling.

## 2020-08-01 ENCOUNTER — Ambulatory Visit: Payer: BC Managed Care – PPO

## 2020-08-08 ENCOUNTER — Telehealth: Payer: Self-pay

## 2020-08-08 NOTE — Telephone Encounter (Signed)
Pt inquired about Dr. Adele Dan next available surgery dates to treat her varicose vein and stated she's still working with her insurance company to find out her out of pocket responsibility. Provided pt with number for [facility] pre-service center, who might also be able to provide pt with estimated financial cost. Pt stated she will call back to schedule.

## 2021-02-21 IMAGING — CT CT HEAD WITHOUT CONTRAST
4 series · 15 of 47 positions shown, 17 images · non-contrast
Comparison: Head CT 12/27/2017 and MRI 12/28/2017

CLINICAL DATA: Diffuse headache beginning 2 days ago with blurred
vision.

EXAM:
CT HEAD WITHOUT CONTRAST
TECHNIQUE: Contiguous axial images were obtained from the base of the skull
through the vertex without intravenous contrast.

[Series 3: head without · axial · non-contrast · 0.44mm/px · z∈[+432,+532]mm · 7 of 28 slices shown, 9 images]
[im 4/28  brain]
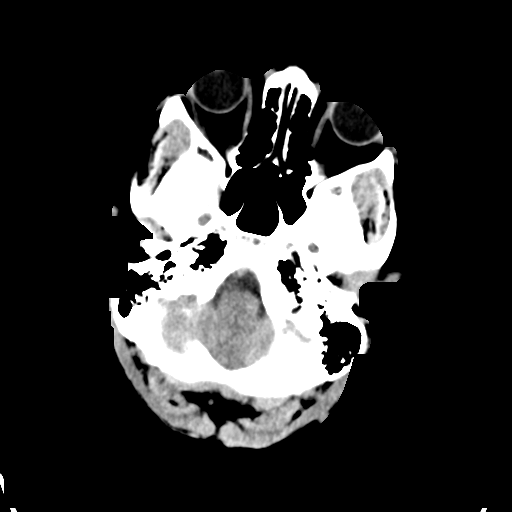
[im 4/28  bone]
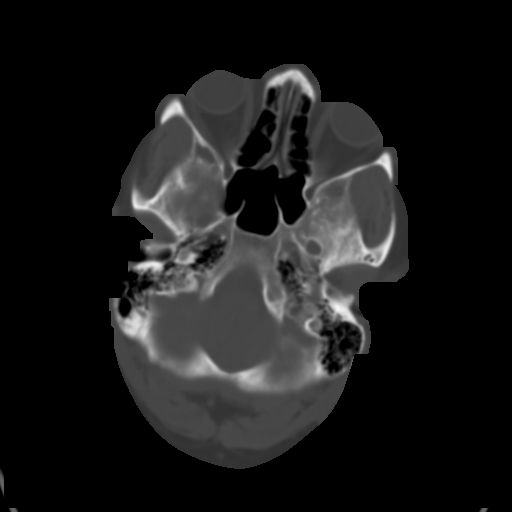
[im 7/28  brain]
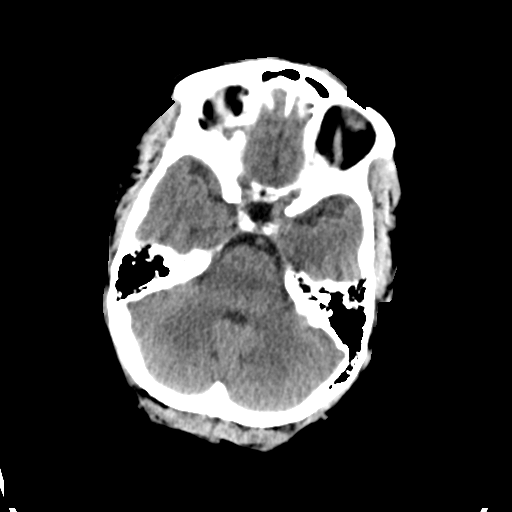
[im 11/28  brain]
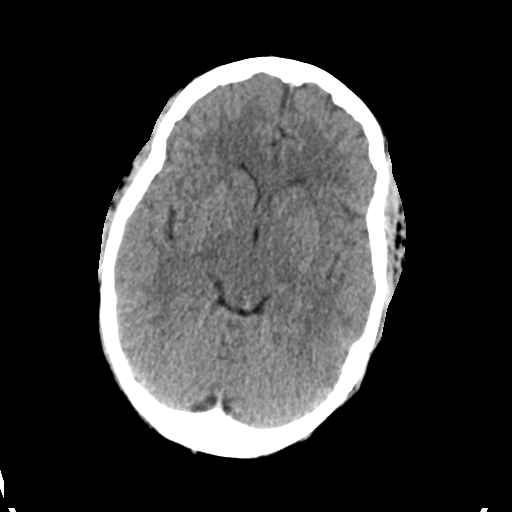
[im 14/28  brain]
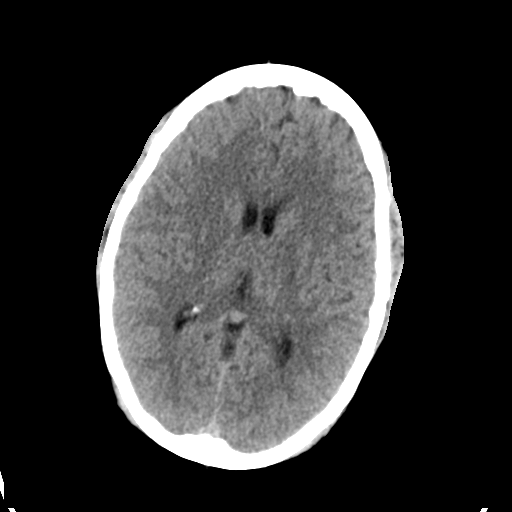
[im 17/28  brain]
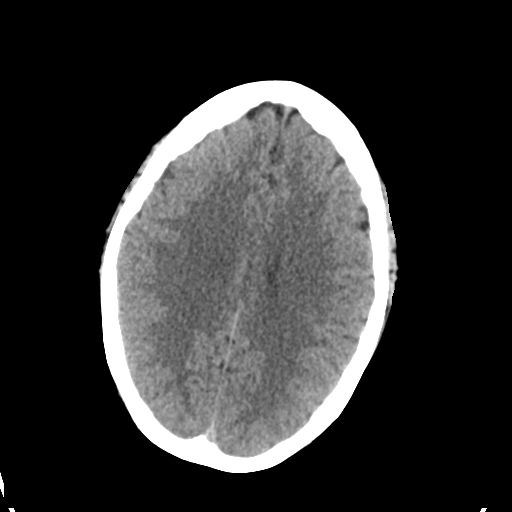
[im 17/28  bone]
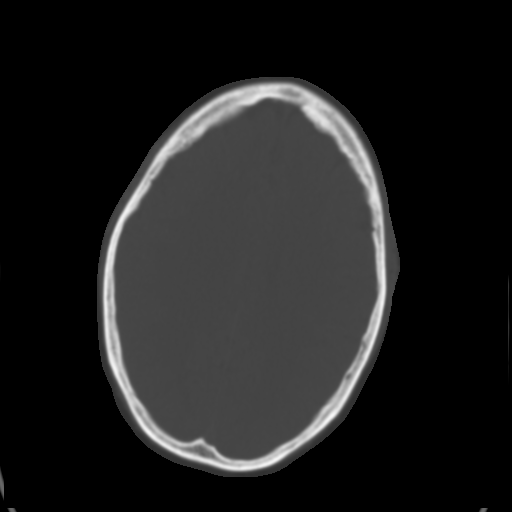
[im 21/28  brain]
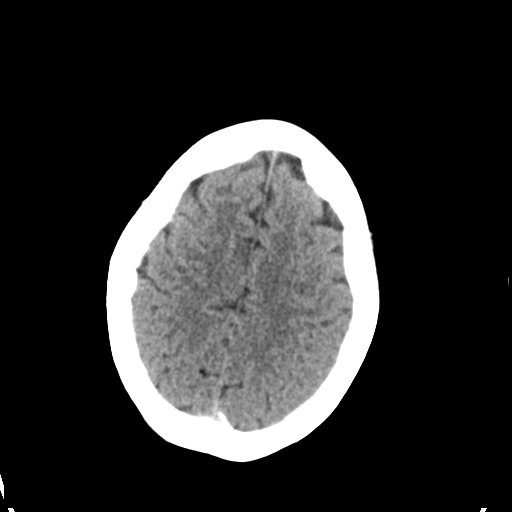
[im 24/28  brain]
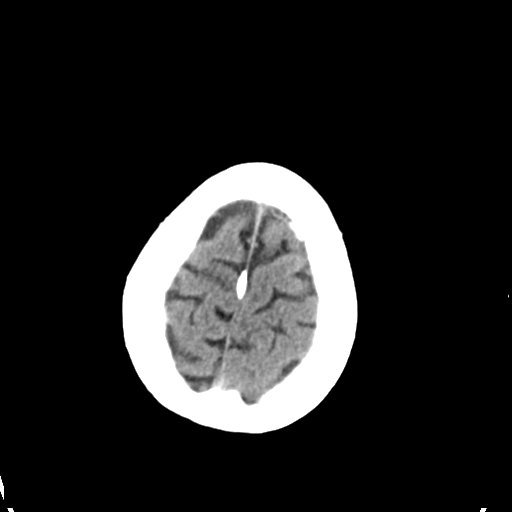

[Series 4: head bone · axial · 0.44mm/px · z∈[+429,+443]mm · 2 of 70 slices shown]
[im 7/70  bone]
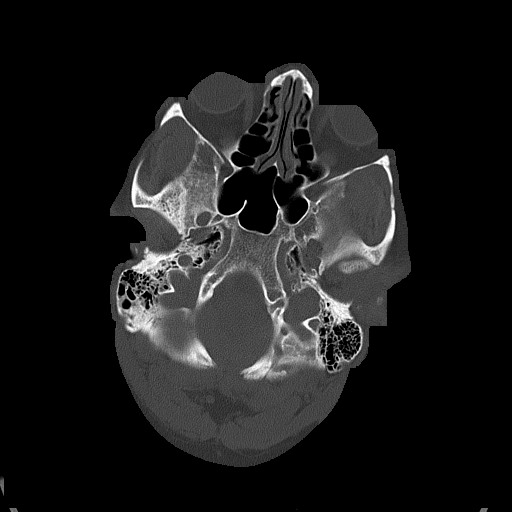
[im 14/70  bone]
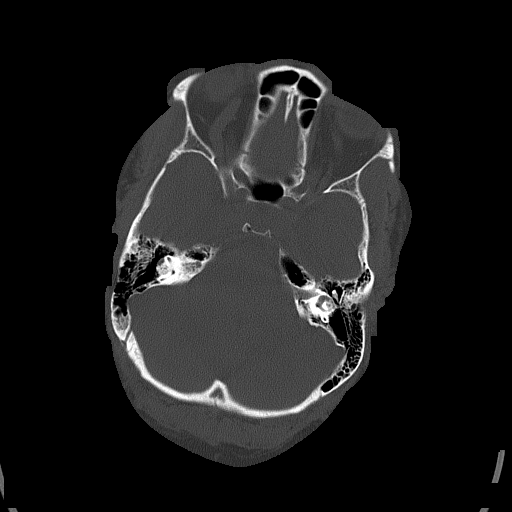

[Series 5: head without cor · coronal · non-contrast · 0.31mm/px · 3 of 72 slices shown]
[im 24/72  brain]
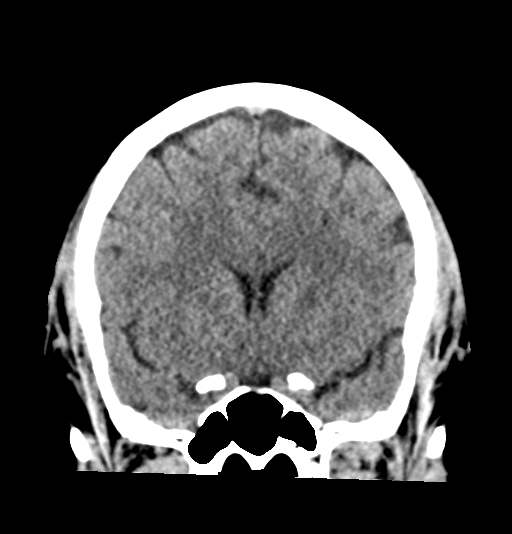
[im 32/72  brain]
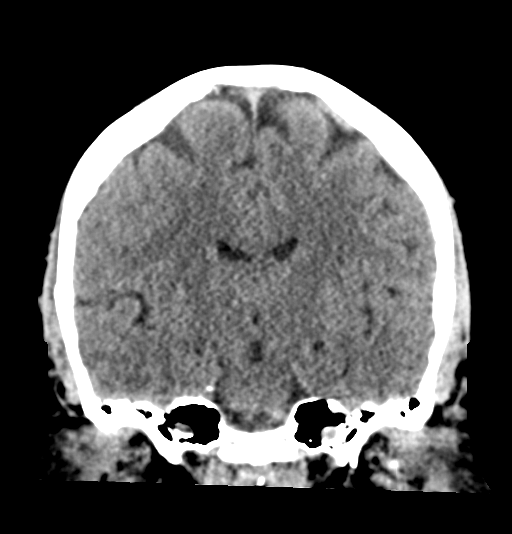
[im 40/72  brain]
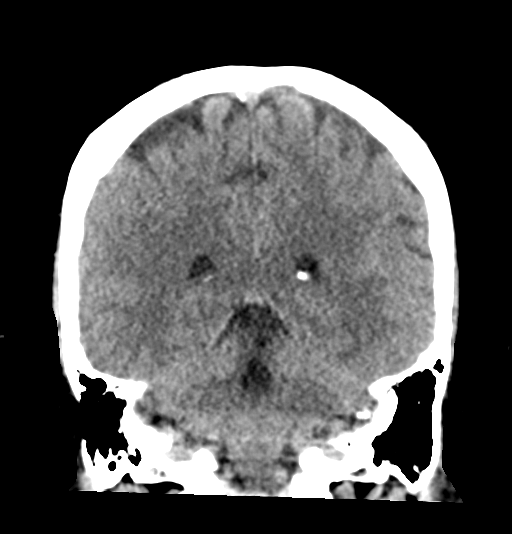

[Series 6: head without sag · sagittal · non-contrast · 0.29mm/px · 3 of 67 slices shown]
[im 23/67  brain]
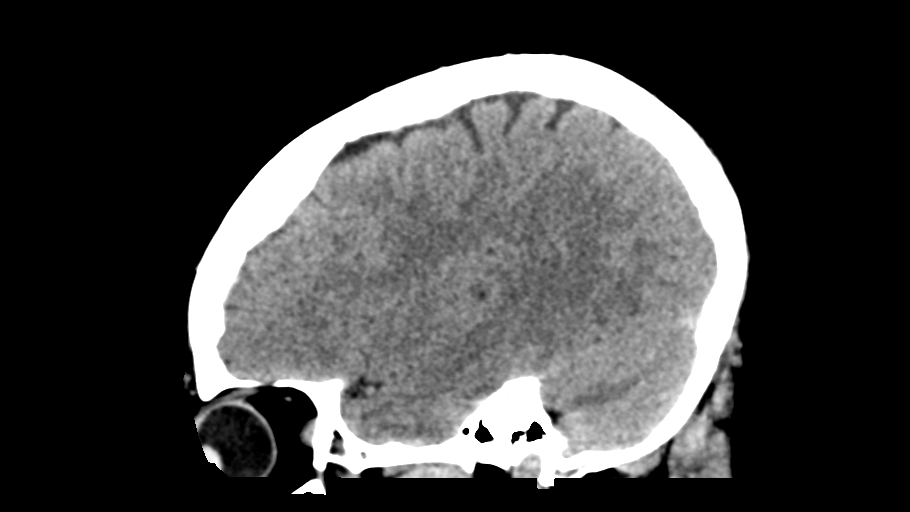
[im 34/67  brain]
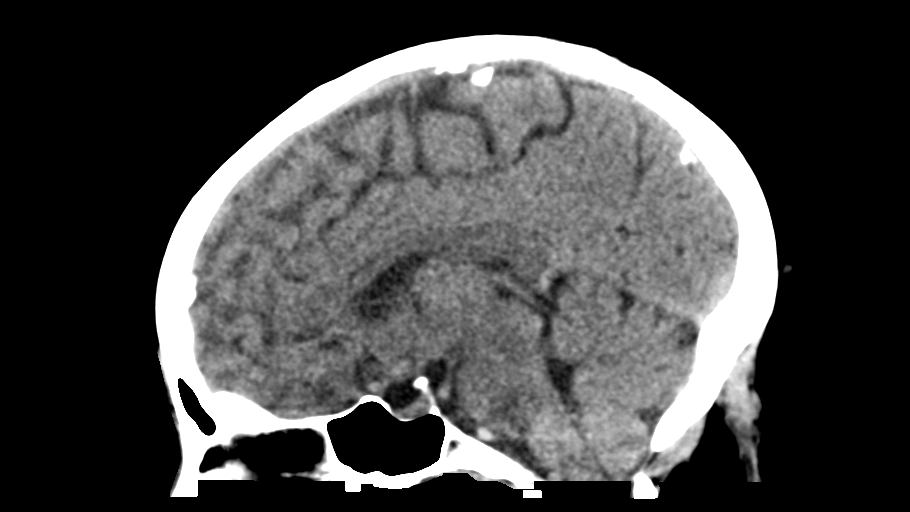
[im 45/67  brain]
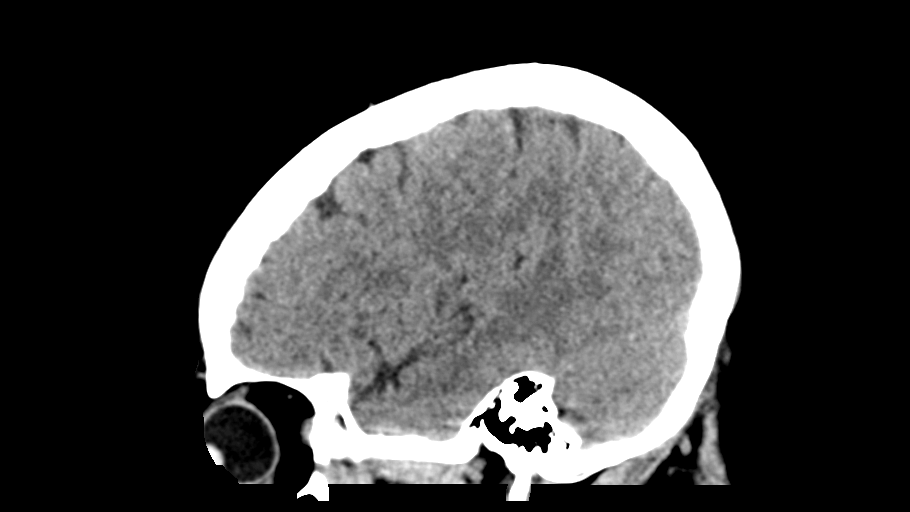

[15 of 47 positions shown; findings below may reference images not displayed]

FINDINGS: Brain: There is no evidence of acute infarct, intracranial
hemorrhage, mass, midline shift, or extra-axial fluid collection.
The ventricles and sulci are normal.

Vascular: No hyperdense vessel.

Skull: No fracture or focal osseous lesion.

Sinuses/Orbits: Visualized paranasal sinuses and mastoid air cells
are clear. Orbits are unremarkable.

Other: None.
IMPRESSION: Unremarkable head CT.

## 2021-03-12 ENCOUNTER — Other Ambulatory Visit: Payer: Self-pay | Admitting: Orthopedic Surgery

## 2021-03-23 ENCOUNTER — Other Ambulatory Visit: Payer: Self-pay

## 2021-03-23 ENCOUNTER — Ambulatory Visit (HOSPITAL_COMMUNITY)
Admission: EM | Admit: 2021-03-23 | Discharge: 2021-03-23 | Disposition: A | Discharge status: Home / Self Care | Payer: BC Managed Care – PPO | Attending: Student | Admitting: Student

## 2021-03-23 ENCOUNTER — Encounter (HOSPITAL_COMMUNITY): Payer: Self-pay | Admitting: Emergency Medicine

## 2021-03-23 DIAGNOSIS — S0501XA Injury of conjunctiva and corneal abrasion without foreign body, right eye, initial encounter: Secondary | ICD-10-CM | POA: Diagnosis not present

## 2021-03-23 MED ORDER — FLUORESCEIN SODIUM 1 MG OP STRP
ORAL_STRIP | OPHTHALMIC | Status: AC
  Filled 2021-03-23: qty 1

## 2021-03-23 MED ORDER — TETRACAINE HCL 0.5 % OP SOLN
OPHTHALMIC | Status: AC
  Filled 2021-03-23: qty 4

## 2021-03-23 MED ORDER — POLYMYXIN B-TRIMETHOPRIM 10000-0.1 UNIT/ML-% OP SOLN: 1.0000 [drp] | OPHTHALMIC | 0 refills | Status: AC

## 2021-03-23 NOTE — Discharge Instructions (Addendum)
-  Polytrim (trimethoprim-polymyxin B) drops, 1-2 drops in the right eye every 4 hours while awake for the next 7 days\ -Follow-up with ophthalmology if symptoms worsen or persist, including vision changes, vision loss, flashes of light or new floaters, new/worsening pain with eye movement, etc.

## 2021-03-23 NOTE — ED Triage Notes (Signed)
Patient presents to [facility] for evaluation of right eye pain starting yesterday.  States she thinks she got grease from sausage she was cooking on her finger and then wipes her eye.  Eye is tender when she palpates it, "swollen" to inner canthus, and watering constantly.  Blurred vision mild.

## 2021-03-23 NOTE — ED Provider Notes (Signed)
[facility]    CSN: [id number] Arrival date & time: 03/23/21  0350      History   Chief Complaint Chief Complaint  Patient presents with  . Eye Pain    HPI Dorothy Williamson is a 60 y.o. female presenting with eye issue. Medical history cataract surgery 6 months ago. Doesn't wear contacts or glasses.  States she thinks she got grease from sausage in her eye.  She was cooking and got some grease on her finger and then wiped her eye.  States for the last 24 hours following this, her eye has been irritated and seems a little swollen with her inner eyelid.  Watering.  Mild foreign body sensation.  Some crusting in the morning.  Vision is mildly blurry.  Denies photophobia, eye pain, eye pain with movement, injury to eye, double vision.  HPI  Past Medical History:  Diagnosis Date  . Chronic knee pain   . Diabetes mellitus without complication (HCC)   . GERD (gastroesophageal reflux disease)   . Hypercholesteremia   . Hypertension   . Nerve pain   . Thyroid disease     Patient Active Problem List   Diagnosis Date Noted  . OBSTRUCTIVE SLEEP APNEA 07/10/2010  . RESTLESS LEGS SYNDROME 07/10/2010  . HYPERLIPIDEMIA 06/23/2010  . HYPERTENSION 06/23/2010  . ARTHRITIS 06/23/2010    Past Surgical History:  Procedure Laterality Date  . ABDOMINAL HYSTERECTOMY    . BREAST SURGERY    . CARDIOVASCULAR STRESS TEST Bilateral 2017   normal results  . CARPAL TUNNEL RELEASE    . CHOLECYSTECTOMY    . ENDOVENOUS ABLATION SAPHENOUS VEIN W/ LASER Right 03/20/2020   endovenous laser ablation right greater saphenous vein and stab phlebectomy 2 incisions right leg by Cari Caraway MD   . JOINT REPLACEMENT      OB History   No obstetric history on file.      Home Medications    Prior to Admission medications   Medication Sig Start Date End Date Taking? Authorizing Provider  amLODipine (NORVASC) 10 MG tablet Take 10 mg by mouth daily.  08/24/18  Yes [provider]   hydrochlorothiazide (HYDRODIURIL) 25 MG tablet Take 25 mg by mouth daily.  08/24/18  Yes [provider]  trimethoprim-polymyxin b (POLYTRIM) ophthalmic solution Place 1 drop into the right eye every 4 (four) hours for 7 days. 03/23/21 03/30/21 Yes Rhys Martini, PA-C  Biotin 1 MG CAPS Take 1 capsule by mouth daily.    [provider]  carbidopa-levodopa (SINEMET IR) 25-100 MG tablet Take 2 tablets by mouth at bedtime.     [provider]  cetirizine (ZYRTEC) 10 MG tablet Take 10 mg by mouth daily.    [provider]  cholecalciferol (VITAMIN D) 1000 units tablet Take 1,000 Units by mouth daily.    [provider]  fluticasone (FLONASE) 50 MCG/ACT nasal spray Place 1 spray into both nostrils daily.  08/24/18   [provider]  gabapentin (NEURONTIN) 300 MG capsule Take 300 mg by mouth 3 (three) times daily.    [provider]  levothyroxine (SYNTHROID, LEVOTHROID) 137 MCG tablet Take 137 mcg by mouth daily before breakfast.    [provider]  magnesium 30 MG tablet Take 30 mg by mouth daily.    [provider]  metFORMIN (GLUCOPHAGE) 500 MG tablet Take 500 mg by mouth 2 (two) times daily before a meal. 03/22/19   [provider]  pantoprazole (PROTONIX) 20 MG tablet Take  20 mg by mouth daily. 03/14/19   [provider]  potassium chloride (K-DUR) 10 MEQ tablet Take 1 tablet by mouth See admin instructions. Patient to take 2 tablets by mouth on Mon, Wed, Fri. 04/03/19   [provider]  simvastatin (ZOCOR) 10 MG tablet Take 10 mg by mouth every evening.     [provider]  zolpidem (AMBIEN) 10 MG tablet Take 10 mg by mouth at bedtime.     [provider]  DULoxetine (CYMBALTA) 60 MG capsule Take 60 mg by mouth daily.  08/27/18 03/23/21  [provider]  POTASSIUM PO Take 1 tablet by mouth daily.  03/23/21  [provider]    Family History Family History  Problem  Relation Age of Onset  . Hypertension Mother   . Hypertension Father   . Diabetes Father   . Cancer Father     Social History Social History   Tobacco Use  . Smoking status: Former Games developer  . Smokeless tobacco: Never Used  Vaping Use  . Vaping Use: Never used  Substance Use Topics  . Alcohol use: No  . Drug use: No     Allergies   Patient has no known allergies.   Review of Systems Review of Systems  Eyes: Positive for discharge, redness, itching and visual disturbance. Negative for photophobia and pain.  All other systems reviewed and are negative.    Physical Exam Triage Vital Signs ED Triage Vitals  Enc Vitals Group     BP 03/23/21 0822 (!) 151/100     Pulse Rate 03/23/21 0822 66     Resp 03/23/21 0822 18     Temp 03/23/21 0822 98.2 F (36.8 C)     Temp Source 03/23/21 0822 Oral     SpO2 03/23/21 0822 97 %     Weight --      Height --      Head Circumference --      Peak Flow --      Pain Score 03/23/21 0823 7     Pain Loc --      Pain Edu? --      Excl. in GC? --    No data found.  Updated Vital Signs BP (!) 151/100 (BP Location: Right Arm)   Pulse 66   Temp 98.2 F (36.8 C) (Oral)   Resp 18   SpO2 97%   Visual Acuity Right Eye Distance: 20/50 Left Eye Distance: 20/40 Bilateral Distance: 20/30  Right Eye Near:   Left Eye Near:    Bilateral Near:     Physical Exam Vitals reviewed.  Constitutional:      General: She is not in acute distress.    Appearance: Normal appearance. She is not ill-appearing or diaphoretic.  HENT:     Head: Normocephalic and atraumatic.  Eyes:     General: Lids are normal. Lids are everted, no foreign bodies appreciated. Vision grossly intact. Gaze aligned appropriately. No visual field deficit.       Right eye: No foreign body, discharge or hordeolum.        Left eye: No foreign body, discharge or hordeolum.     Extraocular Movements: Extraocular movements intact.     Right eye: Normal extraocular motion and  no nystagmus.     Left eye: Normal extraocular motion and no nystagmus.     Conjunctiva/sclera:     Right eye: Right conjunctiva is injected. No chemosis, exudate or hemorrhage.    Left eye: Left conjunctiva  is not injected. No chemosis, exudate or hemorrhage.    Pupils: Pupils are equal, round, and reactive to light.     Right eye: Corneal abrasion and fluorescein uptake present. Seidel exam negative.     Left eye: No corneal abrasion or fluorescein uptake. Seidel exam negative.     Comments: R eye with small abrasion outer aspect. PERRLA, EOMI and without pain. Vision intact.  Cardiovascular:     Rate and Rhythm: Normal rate and regular rhythm.     Heart sounds: Normal heart sounds.  Pulmonary:     Effort: Pulmonary effort is normal.     Breath sounds: Normal breath sounds.  Skin:    General: Skin is warm.  Neurological:     General: No focal deficit present.     Mental Status: She is alert and oriented to person, place, and time.  Psychiatric:        Mood and Affect: Mood normal.        Behavior: Behavior normal.        Thought Content: Thought content normal.        Judgment: Judgment normal.      UC Treatments / Results  Labs (all labs ordered are listed, but only abnormal results are displayed) Labs Reviewed - No data to display  EKG   Radiology No results found.  Procedures Procedures (including critical care time)  Medications Ordered in UC Medications - No data to display  Initial Impression / Assessment and Plan / UC Course  I have reviewed the triage vital signs and the nursing notes.  Pertinent labs & imaging results that were available during my care of the patient were reviewed by me and considered in my medical decision making (see chart for details).    This patient is a 60 year old female presenting with corneal abrasion to right eye. Visual acuity intact. Polytrim drops as below, and f/u with optho if symptoms persist or worsen. ED return  precautions discussed.  Final Clinical Impressions(s) / UC Diagnoses   Final diagnoses:  Abrasion of right cornea, initial encounter     Discharge Instructions     -Polytrim (trimethoprim-polymyxin B) drops, 1-2 drops in the right eye every 4 hours while awake for the next 7 days\ -Follow-up with ophthalmology if symptoms worsen or persist, including vision changes, vision loss, flashes of light or new floaters, new/worsening pain with eye movement, etc.     ED Prescriptions    Medication Sig Dispense Auth. Provider   trimethoprim-polymyxin b (POLYTRIM) ophthalmic solution Place 1 drop into the right eye every 4 (four) hours for 7 days. 10 mL Rhys Martini, PA-C     PDMP not reviewed this encounter.   Rhys Martini, PA-C 03/23/21 [phone]

## 2021-03-27 ENCOUNTER — Encounter (HOSPITAL_BASED_OUTPATIENT_CLINIC_OR_DEPARTMENT_OTHER): Payer: Self-pay | Admitting: Orthopedic Surgery

## 2021-03-27 ENCOUNTER — Other Ambulatory Visit: Payer: Self-pay

## 2021-03-31 ENCOUNTER — Encounter (HOSPITAL_BASED_OUTPATIENT_CLINIC_OR_DEPARTMENT_OTHER)
Admission: RE | Admit: 2021-03-31 | Discharge: 2021-03-31 | Disposition: A | Discharge status: Home / Self Care | Payer: BC Managed Care – PPO | Source: Ambulatory Visit | Attending: Orthopedic Surgery | Admitting: Orthopedic Surgery

## 2021-03-31 ENCOUNTER — Other Ambulatory Visit (HOSPITAL_COMMUNITY)
Admission: RE | Admit: 2021-03-31 | Discharge: 2021-03-31 | Disposition: A | Discharge status: Home / Self Care | Payer: BC Managed Care – PPO | Source: Ambulatory Visit

## 2021-03-31 DIAGNOSIS — Z79899 Other long term (current) drug therapy: Secondary | ICD-10-CM | POA: Diagnosis not present

## 2021-03-31 DIAGNOSIS — Z01812 Encounter for preprocedural laboratory examination: Secondary | ICD-10-CM | POA: Insufficient documentation

## 2021-03-31 DIAGNOSIS — M67441 Ganglion, right hand: Secondary | ICD-10-CM | POA: Diagnosis not present

## 2021-03-31 DIAGNOSIS — L728 Other follicular cysts of the skin and subcutaneous tissue: Secondary | ICD-10-CM | POA: Diagnosis present

## 2021-03-31 DIAGNOSIS — Z8249 Family history of ischemic heart disease and other diseases of the circulatory system: Secondary | ICD-10-CM | POA: Diagnosis not present

## 2021-03-31 DIAGNOSIS — Z20822 Contact with and (suspected) exposure to covid-19: Secondary | ICD-10-CM | POA: Insufficient documentation

## 2021-03-31 DIAGNOSIS — I10 Essential (primary) hypertension: Secondary | ICD-10-CM | POA: Diagnosis not present

## 2021-03-31 DIAGNOSIS — E079 Disorder of thyroid, unspecified: Secondary | ICD-10-CM | POA: Diagnosis not present

## 2021-03-31 DIAGNOSIS — Z833 Family history of diabetes mellitus: Secondary | ICD-10-CM | POA: Diagnosis not present

## 2021-03-31 DIAGNOSIS — K219 Gastro-esophageal reflux disease without esophagitis: Secondary | ICD-10-CM | POA: Diagnosis not present

## 2021-03-31 DIAGNOSIS — Z87891 Personal history of nicotine dependence: Secondary | ICD-10-CM | POA: Diagnosis not present

## 2021-03-31 DIAGNOSIS — Z7984 Long term (current) use of oral hypoglycemic drugs: Secondary | ICD-10-CM | POA: Diagnosis not present

## 2021-03-31 DIAGNOSIS — E119 Type 2 diabetes mellitus without complications: Secondary | ICD-10-CM | POA: Diagnosis not present

## 2021-03-31 LAB — BASIC METABOLIC PANEL
Anion gap: 7 (ref 5–15)
BUN: 11 mg/dL (ref 6–20)
CO2: 31 mmol/L (ref 22–32)
Calcium: 9.3 mg/dL (ref 8.9–10.3)
Chloride: 102 mmol/L (ref 98–111)
Creatinine, Ser: 0.86 mg/dL (ref 0.44–1.00)
GFR, Estimated: >60 mL/min (ref >60)
Glucose, Bld: 123 mg/dL — ABNORMAL HIGH (ref 70–99)
Potassium: 3.9 mmol/L (ref 3.5–5.1)
Sodium: 140 mmol/L (ref 135–145)

## 2021-03-31 LAB — SARS CORONAVIRUS 2 (TAT 6-24 HRS): SARS Coronavirus 2: NEGATIVE

## 2021-03-31 NOTE — Progress Notes (Signed)

## 2021-04-03 ENCOUNTER — Ambulatory Visit (HOSPITAL_BASED_OUTPATIENT_CLINIC_OR_DEPARTMENT_OTHER)
Admission: RE | Admit: 2021-04-03 | Discharge: 2021-04-03 | Disposition: A | Discharge status: Home / Self Care | Payer: BC Managed Care – PPO | Attending: Orthopedic Surgery | Admitting: Orthopedic Surgery

## 2021-04-03 ENCOUNTER — Ambulatory Visit (HOSPITAL_BASED_OUTPATIENT_CLINIC_OR_DEPARTMENT_OTHER): Payer: BC Managed Care – PPO | Admitting: Anesthesiology

## 2021-04-03 ENCOUNTER — Encounter (HOSPITAL_BASED_OUTPATIENT_CLINIC_OR_DEPARTMENT_OTHER)
Admission: RE | Disposition: A | Discharge status: Home / Self Care | Payer: Self-pay | Source: Home / Self Care | Attending: Orthopedic Surgery

## 2021-04-03 ENCOUNTER — Other Ambulatory Visit: Payer: Self-pay

## 2021-04-03 ENCOUNTER — Encounter (HOSPITAL_BASED_OUTPATIENT_CLINIC_OR_DEPARTMENT_OTHER): Payer: Self-pay | Admitting: Orthopedic Surgery

## 2021-04-03 DIAGNOSIS — M67441 Ganglion, right hand: Secondary | ICD-10-CM | POA: Diagnosis not present

## 2021-04-03 DIAGNOSIS — K219 Gastro-esophageal reflux disease without esophagitis: Secondary | ICD-10-CM | POA: Insufficient documentation

## 2021-04-03 DIAGNOSIS — Z7984 Long term (current) use of oral hypoglycemic drugs: Secondary | ICD-10-CM | POA: Insufficient documentation

## 2021-04-03 DIAGNOSIS — I10 Essential (primary) hypertension: Secondary | ICD-10-CM | POA: Insufficient documentation

## 2021-04-03 DIAGNOSIS — Z833 Family history of diabetes mellitus: Secondary | ICD-10-CM | POA: Insufficient documentation

## 2021-04-03 DIAGNOSIS — Z87891 Personal history of nicotine dependence: Secondary | ICD-10-CM | POA: Insufficient documentation

## 2021-04-03 DIAGNOSIS — E079 Disorder of thyroid, unspecified: Secondary | ICD-10-CM | POA: Insufficient documentation

## 2021-04-03 DIAGNOSIS — Z20822 Contact with and (suspected) exposure to covid-19: Secondary | ICD-10-CM | POA: Insufficient documentation

## 2021-04-03 DIAGNOSIS — Z79899 Other long term (current) drug therapy: Secondary | ICD-10-CM | POA: Insufficient documentation

## 2021-04-03 DIAGNOSIS — E119 Type 2 diabetes mellitus without complications: Secondary | ICD-10-CM | POA: Insufficient documentation

## 2021-04-03 DIAGNOSIS — Z8249 Family history of ischemic heart disease and other diseases of the circulatory system: Secondary | ICD-10-CM | POA: Insufficient documentation

## 2021-04-03 HISTORY — DX: Sleep apnea, unspecified: G47.30

## 2021-04-03 HISTORY — DX: Unspecified osteoarthritis, unspecified site: M19.90

## 2021-04-03 HISTORY — PX: EXCISION METACARPAL MASS: SHX6372

## 2021-04-03 LAB — GLUCOSE, CAPILLARY
Glucose-Capillary: 131 mg/dL — ABNORMAL HIGH (ref 70–99)
Glucose-Capillary: 107 mg/dL — ABNORMAL HIGH (ref 70–99)

## 2021-04-03 SURGERY — EXCISION METACARPAL MASS
Anesthesia: General | Site: Finger | Laterality: Right

## 2021-04-03 MED ORDER — OXYCODONE HCL 5 MG/5ML PO SOLN: 5.0000 mg | Freq: Once | ORAL | Status: DC | PRN

## 2021-04-03 MED ORDER — MIDAZOLAM HCL 2 MG/2ML IJ SOLN
INTRAMUSCULAR | Status: AC
  Filled 2021-04-03: qty 2

## 2021-04-03 MED ORDER — FENTANYL CITRATE (PF) 100 MCG/2ML IJ SOLN
INTRAMUSCULAR | Status: AC
  Filled 2021-04-03: qty 2

## 2021-04-03 MED ORDER — CEFAZOLIN SODIUM-DEXTROSE 2-4 GM/100ML-% IV SOLN
2.0000 g | INTRAVENOUS | Status: AC
  Administered 2021-04-03: 2 g via INTRAVENOUS

## 2021-04-03 MED ORDER — FENTANYL CITRATE (PF) 250 MCG/5ML IJ SOLN
INTRAMUSCULAR | Status: DC | PRN
  Administered 2021-04-03 (×2): 25 ug via INTRAVENOUS

## 2021-04-03 MED ORDER — DEXAMETHASONE SODIUM PHOSPHATE 10 MG/ML IJ SOLN
INTRAMUSCULAR | Status: DC | PRN
  Administered 2021-04-03: 10 mg via INTRAVENOUS

## 2021-04-03 MED ORDER — ACETAMINOPHEN 500 MG PO TABS
ORAL_TABLET | ORAL | Status: AC
  Filled 2021-04-03: qty 2

## 2021-04-03 MED ORDER — BUPIVACAINE HCL (PF) 0.25 % IJ SOLN
INTRAMUSCULAR | Status: DC | PRN
  Administered 2021-04-03: 6 mL

## 2021-04-03 MED ORDER — MIDAZOLAM HCL 5 MG/5ML IJ SOLN
INTRAMUSCULAR | Status: DC | PRN
  Administered 2021-04-03: 2 mg via INTRAVENOUS

## 2021-04-03 MED ORDER — ACETAMINOPHEN 500 MG PO TABS
1000.0000 mg | ORAL_TABLET | Freq: Once | ORAL | Status: AC
  Administered 2021-04-03: 1000 mg via ORAL

## 2021-04-03 MED ORDER — ONDANSETRON HCL 4 MG/2ML IJ SOLN
INTRAMUSCULAR | Status: DC | PRN
  Administered 2021-04-03: 4 mg via INTRAVENOUS

## 2021-04-03 MED ORDER — LIDOCAINE 2% (20 MG/ML) 5 ML SYRINGE
INTRAMUSCULAR | Status: DC | PRN
  Administered 2021-04-03: 80 mg via INTRAVENOUS

## 2021-04-03 MED ORDER — HYDROCODONE-ACETAMINOPHEN 5-325 MG PO TABS: ORAL_TABLET | ORAL | 0 refills | Status: DC

## 2021-04-03 MED ORDER — OXYCODONE HCL 5 MG PO TABS: 5.0000 mg | ORAL_TABLET | Freq: Once | ORAL | Status: DC | PRN

## 2021-04-03 MED ORDER — FENTANYL CITRATE (PF) 100 MCG/2ML IJ SOLN: 25.0000 ug | INTRAMUSCULAR | Status: DC | PRN

## 2021-04-03 MED ORDER — PROPOFOL 10 MG/ML IV BOLUS
INTRAVENOUS | Status: DC | PRN
  Administered 2021-04-03: 170 mg via INTRAVENOUS

## 2021-04-03 MED ORDER — CEFAZOLIN SODIUM-DEXTROSE 2-4 GM/100ML-% IV SOLN
INTRAVENOUS | Status: AC
  Filled 2021-04-03: qty 100

## 2021-04-03 MED ORDER — PROPOFOL 10 MG/ML IV BOLUS
INTRAVENOUS | Status: AC
  Filled 2021-04-03: qty 20

## 2021-04-03 MED ORDER — PROMETHAZINE HCL 25 MG/ML IJ SOLN: 6.2500 mg | INTRAMUSCULAR | Status: DC | PRN

## 2021-04-03 MED ORDER — LACTATED RINGERS IV SOLN: INTRAVENOUS | Status: DC

## 2021-04-03 MED ORDER — LIDOCAINE 2% (20 MG/ML) 5 ML SYRINGE
INTRAMUSCULAR | Status: AC
  Filled 2021-04-03: qty 5

## 2021-04-03 SURGICAL SUPPLY — 53 items
APL PRP STRL LF DISP 70% ISPRP (MISCELLANEOUS) ×1
APL SKNCLS STERI-STRIP NONHPOA (GAUZE/BANDAGES/DRESSINGS)
BENZOIN TINCTURE PRP APPL 2/3 (GAUZE/BANDAGES/DRESSINGS) IMPLANT
BLADE MINI RND TIP GREEN BEAV (BLADE) IMPLANT
BLADE SURG 15 STRL LF DISP TIS (BLADE) ×2 IMPLANT
BLADE SURG 15 STRL SS (BLADE) ×4
BNDG CMPR 9X4 STRL LF SNTH (GAUZE/BANDAGES/DRESSINGS) ×1
BNDG COHESIVE 1X5 TAN STRL LF (GAUZE/BANDAGES/DRESSINGS) ×2 IMPLANT
BNDG COHESIVE 2X5 TAN STRL LF (GAUZE/BANDAGES/DRESSINGS) ×2 IMPLANT
BNDG CONFORM 2 STRL LF (GAUZE/BANDAGES/DRESSINGS) IMPLANT
BNDG ELASTIC 2X5.8 VLCR STR LF (GAUZE/BANDAGES/DRESSINGS) IMPLANT
BNDG ELASTIC 3X5.8 VLCR STR LF (GAUZE/BANDAGES/DRESSINGS) IMPLANT
BNDG ESMARK 4X9 LF (GAUZE/BANDAGES/DRESSINGS) ×2 IMPLANT
BNDG GAUZE 1X2.1 STRL (MISCELLANEOUS) IMPLANT
BNDG GAUZE ELAST 4 BULKY (GAUZE/BANDAGES/DRESSINGS) IMPLANT
BNDG PLASTER X FAST 3X3 WHT LF (CAST SUPPLIES) IMPLANT
BNDG PLSTR 9X3 FST ST WHT (CAST SUPPLIES)
CHLORAPREP W/TINT 26 (MISCELLANEOUS) ×2 IMPLANT
CORD BIPOLAR FORCEPS 12FT (ELECTRODE) ×2 IMPLANT
COVER BACK TABLE 60X90IN (DRAPES) ×2 IMPLANT
COVER MAYO STAND STRL (DRAPES) ×2 IMPLANT
COVER WAND RF STERILE (DRAPES) IMPLANT
CUFF TOURN SGL QUICK 18X4 (TOURNIQUET CUFF) ×2 IMPLANT
DRAPE EXTREMITY T 121X128X90 (DISPOSABLE) ×2 IMPLANT
DRAPE SURG 17X23 STRL (DRAPES) ×2 IMPLANT
GAUZE SPONGE 4X4 12PLY STRL (GAUZE/BANDAGES/DRESSINGS) ×2 IMPLANT
GAUZE XEROFORM 1X8 LF (GAUZE/BANDAGES/DRESSINGS) ×2 IMPLANT
GLOVE SRG 8 PF TXTR STRL LF DI (GLOVE) ×1 IMPLANT
GLOVE SURG ENC MOIS LTX SZ7.5 (GLOVE) ×2 IMPLANT
GLOVE SURG LTX SZ8 (GLOVE) ×2 IMPLANT
GLOVE SURG UNDER POLY LF SZ7 (GLOVE) ×2 IMPLANT
GLOVE SURG UNDER POLY LF SZ8 (GLOVE) ×2
GOWN STRL REUS W/ TWL LRG LVL3 (GOWN DISPOSABLE) IMPLANT
GOWN STRL REUS W/TWL 2XL LVL3 (GOWN DISPOSABLE) ×2 IMPLANT
GOWN STRL REUS W/TWL LRG LVL3 (GOWN DISPOSABLE)
GOWN STRL REUS W/TWL XL LVL3 (GOWN DISPOSABLE) ×2 IMPLANT
NEEDLE HYPO 25X1 1.5 SAFETY (NEEDLE) ×2 IMPLANT
NS IRRIG 1000ML POUR BTL (IV SOLUTION) ×2 IMPLANT
PACK BASIN DAY SURGERY FS (CUSTOM PROCEDURE TRAY) ×2 IMPLANT
PAD CAST 3X4 CTTN HI CHSV (CAST SUPPLIES) IMPLANT
PAD CAST 4YDX4 CTTN HI CHSV (CAST SUPPLIES) IMPLANT
PADDING CAST ABS 4INX4YD NS (CAST SUPPLIES) ×1
PADDING CAST ABS COTTON 4X4 ST (CAST SUPPLIES) ×1 IMPLANT
PADDING CAST COTTON 3X4 STRL (CAST SUPPLIES)
PADDING CAST COTTON 4X4 STRL (CAST SUPPLIES)
STOCKINETTE 4X48 STRL (DRAPES) ×2 IMPLANT
STRIP CLOSURE SKIN 1/2X4 (GAUZE/BANDAGES/DRESSINGS) IMPLANT
SUT ETHILON 3 0 PS 1 (SUTURE) IMPLANT
SUT ETHILON 4 0 PS 2 18 (SUTURE) ×2 IMPLANT
SYR BULB EAR ULCER 3OZ GRN STR (SYRINGE) ×2 IMPLANT
SYR CONTROL 10ML LL (SYRINGE) ×2 IMPLANT
TOWEL GREEN STERILE FF (TOWEL DISPOSABLE) ×4 IMPLANT
UNDERPAD 30X36 HEAVY ABSORB (UNDERPADS AND DIAPERS) ×2 IMPLANT

## 2021-04-03 NOTE — H&P (Signed)
Dorothy Williamson is an 60 y.o. female.   Chief Complaint: finger mass HPI: 60 yo female with mass right ring finger.  It is bothersome to her.  She wishes to have it removed.  Allergies: No Known Allergies  Past Medical History:  Diagnosis Date  . Arthritis   . Chronic knee pain   . Diabetes mellitus without complication (HCC)   . GERD (gastroesophageal reflux disease)   . Hypercholesteremia   . Hypertension   . Nerve pain   . Sleep apnea    does not use CPAP  . Thyroid disease     Past Surgical History:  Procedure Laterality Date  . ABDOMINAL HYSTERECTOMY    . BREAST SURGERY    . CARDIOVASCULAR STRESS TEST Bilateral 2017   normal results  . CARPAL TUNNEL RELEASE    . CHOLECYSTECTOMY    . ENDOVENOUS ABLATION SAPHENOUS VEIN W/ LASER Right 03/20/2020   endovenous laser ablation right greater saphenous vein and stab phlebectomy 2 incisions right leg by Cari Caraway MD   . JOINT REPLACEMENT      Family History: Family History  Problem Relation Age of Onset  . Hypertension Mother   . Hypertension Father   . Diabetes Father   . Cancer Father     Social History:   reports that she has quit smoking. She has never used smokeless tobacco. She reports that she does not drink alcohol and does not use drugs.  Medications: Medications Prior to Admission  Medication Sig Dispense Refill  . amLODipine (NORVASC) 10 MG tablet Take 10 mg by mouth daily.     . Biotin 1 MG CAPS Take 1 capsule by mouth daily.    . carbidopa-levodopa (SINEMET IR) 25-100 MG tablet Take 2 tablets by mouth at bedtime.     . cetirizine (ZYRTEC) 10 MG tablet Take 10 mg by mouth daily.    . cholecalciferol (VITAMIN D) 1000 units tablet Take 1,000 Units by mouth daily.    Marland Kitchen gabapentin (NEURONTIN) 300 MG capsule Take 300 mg by mouth 3 (three) times daily.    . hydrochlorothiazide (HYDRODIURIL) 25 MG tablet Take 25 mg by mouth daily.     Marland Kitchen levothyroxine (SYNTHROID, LEVOTHROID) 137 MCG tablet Take 137 mcg by  mouth daily before breakfast.    . magnesium 30 MG tablet Take 30 mg by mouth daily.    . metFORMIN (GLUCOPHAGE) 500 MG tablet Take 500 mg by mouth 2 (two) times daily before a meal.    . pantoprazole (PROTONIX) 20 MG tablet Take 20 mg by mouth daily.    . simvastatin (ZOCOR) 10 MG tablet Take 10 mg by mouth every evening.     . zolpidem (AMBIEN) 10 MG tablet Take 10 mg by mouth at bedtime.    . fluticasone (FLONASE) 50 MCG/ACT nasal spray Place 1 spray into both nostrils daily.       No results found for this or any previous visit (from the past 48 hour(s)).  No results found.   A comprehensive review of systems was negative.  Blood pressure 132/76, pulse 73, temperature 98.4 F (36.9 C), temperature source Oral, resp. rate 16, height 5\' 4"  (1.626 m), weight 92.7 kg, SpO2 100 %.  General appearance: alert, cooperative and appears stated age Head: Normocephalic, without obvious abnormality, atraumatic Neck: supple, symmetrical, trachea midline Cardio: regular rate and rhythm Resp: clear to auscultation bilaterally Extremities: Intact sensation and capillary refill all digits.  +epl/fpl/io.  No wounds.  Pulses: 2+ and symmetric Skin: Skin  color, texture, turgor normal. No rashes or lesions Neurologic: Grossly normal Incision/Wound: none  Assessment/Plan Right ring finger mass.  Non operative and operative treatment options have been discussed with the patient and patient wishes to proceed with operative treatment. Risks, benefits, and alternatives of surgery have been discussed and the patient agrees with the plan of care.   Betha Loa 04/03/2021, 12:20 PM

## 2021-04-03 NOTE — Discharge Instructions (Addendum)
  Next dose of Tylenol can be given after 6:09PM.    Post Anesthesia Home Care Instructions  Activity: Get plenty of rest for the remainder of the day. A responsible individual must stay with you for 24 hours following the procedure.  For the next 24 hours, DO NOT: -Drive a car -Advertising copywriter -Drink alcoholic beverages -Take any medication unless instructed by your physician -Make any legal decisions or sign important papers.  Meals: Start with liquid foods such as gelatin or soup. Progress to regular foods as tolerated. Avoid greasy, spicy, heavy foods. If nausea and/or vomiting occur, drink only clear liquids until the nausea and/or vomiting subsides. Call your physician if vomiting continues.  Special Instructions/Symptoms: Your throat may feel dry or sore from the anesthesia or the breathing tube placed in your throat during surgery. If this causes discomfort, gargle with warm salt water. The discomfort should disappear within 24 hours.  If you had a scopolamine patch placed behind your ear for the management of post- operative nausea and/or vomiting:  1. The medication in the patch is effective for 72 hours, after which it should be removed.  Wrap patch in a tissue and discard in the trash. Wash hands thoroughly with soap and water. 2. You may remove the patch earlier than 72 hours if you experience unpleasant side effects which may include dry mouth, dizziness or visual disturbances. 3. Avoid touching the patch. Wash your hands with soap and water after contact with the patch.          [facility] Instructions Hand Surgery  Wound Care: Keep your hand elevated above the level of your heart.  Do not allow it to dangle by your side.  Keep the dressing dry and do not remove it unless your doctor advises you to do so.  He will usually change it at the time of your post-op visit.  Moving your fingers is advised to stimulate circulation but will depend on the site of your  surgery.  If you have a splint applied, your doctor will advise you regarding movement.  Activity: Do not drive or operate machinery today.  Rest today and then you may return to your normal activity and work as indicated by your physician.  Diet:  Drink liquids today or eat a light diet.  You may resume a regular diet tomorrow.    General expectations: Pain for two to three days. Fingers may become slightly swollen.  Call your doctor if any of the following occur: Severe pain not relieved by pain medication. Elevated temperature. Dressing soaked with blood. Inability to move fingers. White or bluish color to fingers.

## 2021-04-03 NOTE — Op Note (Signed)
NAME: Dorothy Williamson MEDICAL RECORD NO: [id number] DATE OF BIRTH: [birth year] FACILITY: [facility] LOCATION: [facility] PHYSICIAN: Tami Ribas, MD   OPERATIVE REPORT   DATE OF PROCEDURE: 04/03/21    PREOPERATIVE DIAGNOSIS:   Right ring finger mass   POSTOPERATIVE DIAGNOSIS:   Right ring finger annular ligament cyst   PROCEDURE:   Excision of right ring finger annular ligament cyst, 1.0 cm   SURGEON:  Betha Loa, M.D.   ASSISTANT: none   ANESTHESIA:  General   INTRAVENOUS FLUIDS:  Per anesthesia flow sheet.   ESTIMATED BLOOD LOSS:  Minimal.   COMPLICATIONS:  None.   SPECIMENS:   Right ring finger mass to pathology   TOURNIQUET TIME:    Total Tourniquet Time Documented: Upper Arm (Right) - 13 minutes Total: Upper Arm (Right) - 13 minutes    DISPOSITION:  Stable to PACU.   INDICATIONS: 60 year old female has noted a mass on the right ring finger.  This is bothersome to her.  She wishes to have it removed. Risks, benefits and alternatives of surgery were discussed including the risks of blood loss, infection, damage to nerves, vessels, tendons, ligaments, bone for surgery, need for additional surgery, complications with wound healing, continued pain, stiffness, recurrence.  She voiced understanding of these risks and elected to proceed.  OPERATIVE COURSE:  After being identified preoperatively by myself,  the patient and I agreed on the procedure and site of the procedure.  The surgical site was marked.  Surgical consent had been signed. She was given IV antibiotics as preoperative antibiotic prophylaxis. She was transferred to the operating room and placed on the operating table in supine position with the Right upper extremity on an arm board.  General anesthesia was induced by the anesthesiologist.  Right upper extremity was prepped and draped in normal sterile orthopedic fashion.  A surgical pause was performed between the surgeons, anesthesia, and operating  room staff and all were in agreement as to the patient, procedure, and site of procedure.  Tourniquet at the proximal aspect of the extremity was inflated to 250 mmHg after exsanguination of the arm with an Esmarch bandage.    Incision was made over the mass at the proximal phalanx of the right ring finger.  This was carried in subcutaneous tissues by spreading technique.  The mass was carefully freed of soft tissue attachments.  It was coming from the A2 pulley.  The mass was excised and sent to pathology for examination.  It measured 1.0 cm in diameter.  There was a rent in the A2 pulley proximally 1 to 2 mm from the distal edge.  This was released out to the distal edge and the edges excised to try to prevent recurrence.  The wound was copiously irrigated with sterile saline.  The radial and ulnar digital nerve and artery were identified and were intact.  The wound was closed with 4-0 nylon in a horizontal mattress fashion.  A digital block was performed with quarter percent plain Marcaine to aid in postoperative analgesia.  The wound was dressed with sterile Xeroform 4 x 4's and wrapped with a Coban dressing lightly.  The tourniquet was deflated at 13 minutes.  Fingertips were pink with brisk capillary refill after deflation of tourniquet.  The operative  drapes were broken down.  The patient was awoken from anesthesia safely.  She was transferred back to the stretcher and taken to PACU in stable condition.  I will see her back in the office in  1 week for postoperative followup.  I will give her a prescription for Norco 5/325 1-2 tabs PO q6 hours prn pain, dispense # 20.   Betha Loa, MD Electronically signed, 04/03/21

## 2021-04-03 NOTE — Anesthesia Procedure Notes (Signed)
Procedure Name: LMA Insertion Date/Time: 04/03/2021 2:12 PM Performed by: Lucinda Dell, CRNA Pre-anesthesia Checklist: Patient identified, Emergency Drugs available, Suction available and Patient being monitored Patient Re-evaluated:Patient Re-evaluated prior to induction Oxygen Delivery Method: Circle system utilized Preoxygenation: Pre-oxygenation with 100% oxygen Induction Type: IV induction Ventilation: Mask ventilation without difficulty LMA: LMA inserted LMA Size: 4.0 Number of attempts: 1 Placement Confirmation: positive ETCO2 and breath sounds checked- equal and bilateral Tube secured with: Tape Dental Injury: Teeth and Oropharynx as per pre-operative assessment

## 2021-04-03 NOTE — Anesthesia Postprocedure Evaluation (Signed)
Anesthesia Post Note  Patient: Dorothy Williamson  Procedure(s) Performed: EXCISION OF MASS RIGHT RING FINGER (Right Finger)     Patient location during evaluation: Phase II Anesthesia Type: General Level of consciousness: awake Pain management: pain level controlled Vital Signs Assessment: post-procedure vital signs reviewed and stable Respiratory status: spontaneous breathing Cardiovascular status: stable Postop Assessment: no apparent nausea or vomiting Anesthetic complications: no   No complications documented.  Last Vitals:  Vitals:   04/03/21 1500 04/03/21 1527  BP: 137/85 135/84  Pulse: 70 71  Resp: 16 17  Temp:  36.6 C  SpO2: 96% 94%    Last Pain:  Vitals:   04/03/21 1516  TempSrc:   PainSc: 0-No pain                 Caren Macadam

## 2021-04-03 NOTE — Anesthesia Preprocedure Evaluation (Addendum)
Anesthesia Evaluation  Patient identified by MRN, date of birth, ID band Patient awake    Reviewed: Allergy & Precautions, NPO status , Patient's Chart, lab work & pertinent test results  History of Anesthesia Complications Negative for: history of anesthetic complications  Airway Mallampati: II  TM Distance: >3 FB Neck ROM: Full    Dental  (+) Missing,    Pulmonary sleep apnea (noncompliant with CPAP) , former smoker,    Pulmonary exam normal        Cardiovascular hypertension, Pt. on medications Normal cardiovascular exam     Neuro/Psych negative neurological ROS  negative psych ROS   GI/Hepatic Neg liver ROS, GERD  Controlled and Medicated,  Endo/Other  diabetes, Type 2, Oral Hypoglycemic AgentsHypothyroidism   Renal/GU negative Renal ROS  negative genitourinary   Musculoskeletal  (+) Arthritis ,   Abdominal   Peds  Hematology negative hematology ROS (+)   Anesthesia Other Findings Day of surgery medications reviewed with patient.  Reproductive/Obstetrics negative OB ROS                            Anesthesia Physical Anesthesia Plan  ASA: III  Anesthesia Plan: General   Post-op Pain Management:    Induction: Intravenous  PONV Risk Score and Plan: 3 and Treatment may vary due to age or medical condition, Ondansetron and Midazolam  Airway Management Planned: LMA  Additional Equipment: None  Intra-op Plan:   Post-operative Plan: Extubation in OR  Informed Consent: I have reviewed the patients History and Physical, chart, labs and discussed the procedure including the risks, benefits and alternatives for the proposed anesthesia with the patient or authorized representative who has indicated his/her understanding and acceptance.     Dental advisory given  Plan Discussed with: CRNA  Anesthesia Plan Comments:        Anesthesia Quick Evaluation

## 2021-04-03 NOTE — Transfer of Care (Signed)
Immediate Anesthesia Transfer of Care Note  Patient: Melodie Ashworth  Procedure(s) Performed: EXCISION OF MASS RIGHT RING FINGER (Right Finger)  Patient Location: PACU  Anesthesia Type:General  Level of Consciousness: awake, alert , oriented and patient cooperative  Airway & Oxygen Therapy: Patient Spontanous Breathing and Patient connected to nasal cannula oxygen  Post-op Assessment: Report given to RN, Post -op Vital signs reviewed and stable and Patient moving all extremities  Post vital signs: Reviewed and stable  Last Vitals:  Vitals Value Taken Time  BP 134/97 04/03/21 1440  Temp    Pulse 74 04/03/21 1443  Resp 10 04/03/21 1443  SpO2 98 % 04/03/21 1443  Vitals shown include unvalidated device data.  Last Pain:  Vitals:   04/03/21 1201  TempSrc: Oral  PainSc: 0-No pain         Complications: No complications documented.

## 2021-04-06 ENCOUNTER — Encounter (HOSPITAL_BASED_OUTPATIENT_CLINIC_OR_DEPARTMENT_OTHER): Payer: Self-pay | Admitting: Orthopedic Surgery

## 2021-04-06 LAB — SURGICAL PATHOLOGY

## 2022-06-15 DIAGNOSIS — G4733 Obstructive sleep apnea (adult) (pediatric): Secondary | ICD-10-CM | POA: Diagnosis not present

## 2022-06-15 DIAGNOSIS — E039 Hypothyroidism, unspecified: Secondary | ICD-10-CM | POA: Diagnosis not present

## 2022-06-15 DIAGNOSIS — E876 Hypokalemia: Secondary | ICD-10-CM | POA: Diagnosis not present

## 2022-06-15 DIAGNOSIS — Z800 Family history of malignant neoplasm of digestive organs: Secondary | ICD-10-CM | POA: Diagnosis not present

## 2022-06-15 DIAGNOSIS — I10 Essential (primary) hypertension: Secondary | ICD-10-CM | POA: Diagnosis not present

## 2022-06-15 DIAGNOSIS — E559 Vitamin D deficiency, unspecified: Secondary | ICD-10-CM | POA: Diagnosis not present

## 2022-06-15 DIAGNOSIS — F411 Generalized anxiety disorder: Secondary | ICD-10-CM | POA: Diagnosis not present

## 2022-06-15 DIAGNOSIS — E782 Mixed hyperlipidemia: Secondary | ICD-10-CM | POA: Diagnosis not present

## 2022-06-15 DIAGNOSIS — E1165 Type 2 diabetes mellitus with hyperglycemia: Secondary | ICD-10-CM | POA: Diagnosis not present

## 2022-06-17 DIAGNOSIS — L659 Nonscarring hair loss, unspecified: Secondary | ICD-10-CM | POA: Diagnosis not present

## 2022-07-19 DIAGNOSIS — E1165 Type 2 diabetes mellitus with hyperglycemia: Secondary | ICD-10-CM | POA: Diagnosis not present

## 2022-07-19 DIAGNOSIS — E876 Hypokalemia: Secondary | ICD-10-CM | POA: Diagnosis not present

## 2022-07-19 DIAGNOSIS — G4709 Other insomnia: Secondary | ICD-10-CM | POA: Diagnosis not present

## 2022-07-19 DIAGNOSIS — I10 Essential (primary) hypertension: Secondary | ICD-10-CM | POA: Diagnosis not present

## 2022-08-31 DIAGNOSIS — L659 Nonscarring hair loss, unspecified: Secondary | ICD-10-CM | POA: Diagnosis not present

## 2022-09-24 DIAGNOSIS — M159 Polyosteoarthritis, unspecified: Secondary | ICD-10-CM | POA: Diagnosis not present

## 2022-09-24 DIAGNOSIS — D869 Sarcoidosis, unspecified: Secondary | ICD-10-CM | POA: Diagnosis not present

## 2022-10-05 DIAGNOSIS — L659 Nonscarring hair loss, unspecified: Secondary | ICD-10-CM | POA: Diagnosis not present

## 2022-10-11 DIAGNOSIS — E782 Mixed hyperlipidemia: Secondary | ICD-10-CM | POA: Diagnosis not present

## 2022-10-11 DIAGNOSIS — M810 Age-related osteoporosis without current pathological fracture: Secondary | ICD-10-CM | POA: Diagnosis not present

## 2022-10-11 DIAGNOSIS — R001 Bradycardia, unspecified: Secondary | ICD-10-CM | POA: Diagnosis not present

## 2022-10-11 DIAGNOSIS — R600 Localized edema: Secondary | ICD-10-CM | POA: Diagnosis not present

## 2022-10-11 DIAGNOSIS — E559 Vitamin D deficiency, unspecified: Secondary | ICD-10-CM | POA: Diagnosis not present

## 2022-10-11 DIAGNOSIS — E1165 Type 2 diabetes mellitus with hyperglycemia: Secondary | ICD-10-CM | POA: Diagnosis not present

## 2022-10-11 DIAGNOSIS — I10 Essential (primary) hypertension: Secondary | ICD-10-CM | POA: Diagnosis not present

## 2022-10-11 DIAGNOSIS — Z0000 Encounter for general adult medical examination without abnormal findings: Secondary | ICD-10-CM | POA: Diagnosis not present

## 2022-10-11 DIAGNOSIS — E039 Hypothyroidism, unspecified: Secondary | ICD-10-CM | POA: Diagnosis not present

## 2022-10-25 DIAGNOSIS — Z8042 Family history of malignant neoplasm of prostate: Secondary | ICD-10-CM | POA: Diagnosis not present

## 2022-10-25 DIAGNOSIS — Z1231 Encounter for screening mammogram for malignant neoplasm of breast: Secondary | ICD-10-CM | POA: Diagnosis not present

## 2022-10-25 DIAGNOSIS — Z800 Family history of malignant neoplasm of digestive organs: Secondary | ICD-10-CM | POA: Diagnosis not present

## 2022-11-24 DIAGNOSIS — L659 Nonscarring hair loss, unspecified: Secondary | ICD-10-CM | POA: Diagnosis not present

## 2022-12-15 DIAGNOSIS — E782 Mixed hyperlipidemia: Secondary | ICD-10-CM | POA: Diagnosis not present

## 2022-12-15 DIAGNOSIS — K219 Gastro-esophageal reflux disease without esophagitis: Secondary | ICD-10-CM | POA: Diagnosis not present

## 2022-12-15 DIAGNOSIS — M25551 Pain in right hip: Secondary | ICD-10-CM | POA: Diagnosis not present

## 2022-12-15 DIAGNOSIS — E559 Vitamin D deficiency, unspecified: Secondary | ICD-10-CM | POA: Diagnosis not present

## 2022-12-15 DIAGNOSIS — H68002 Unspecified Eustachian salpingitis, left ear: Secondary | ICD-10-CM | POA: Diagnosis not present

## 2022-12-15 DIAGNOSIS — M24852 Other specific joint derangements of left hip, not elsewhere classified: Secondary | ICD-10-CM | POA: Diagnosis not present

## 2022-12-15 DIAGNOSIS — E876 Hypokalemia: Secondary | ICD-10-CM | POA: Diagnosis not present

## 2022-12-15 DIAGNOSIS — M24851 Other specific joint derangements of right hip, not elsewhere classified: Secondary | ICD-10-CM | POA: Diagnosis not present

## 2022-12-15 DIAGNOSIS — G2581 Restless legs syndrome: Secondary | ICD-10-CM | POA: Diagnosis not present

## 2022-12-15 DIAGNOSIS — M810 Age-related osteoporosis without current pathological fracture: Secondary | ICD-10-CM | POA: Diagnosis not present

## 2022-12-15 DIAGNOSIS — E1165 Type 2 diabetes mellitus with hyperglycemia: Secondary | ICD-10-CM | POA: Diagnosis not present

## 2022-12-15 DIAGNOSIS — E038 Other specified hypothyroidism: Secondary | ICD-10-CM | POA: Diagnosis not present

## 2022-12-15 DIAGNOSIS — R1031 Right lower quadrant pain: Secondary | ICD-10-CM | POA: Diagnosis not present

## 2022-12-15 DIAGNOSIS — G4709 Other insomnia: Secondary | ICD-10-CM | POA: Diagnosis not present

## 2022-12-15 DIAGNOSIS — I10 Essential (primary) hypertension: Secondary | ICD-10-CM | POA: Diagnosis not present

## 2022-12-29 DIAGNOSIS — L659 Nonscarring hair loss, unspecified: Secondary | ICD-10-CM | POA: Diagnosis not present

## 2023-01-26 DIAGNOSIS — L659 Nonscarring hair loss, unspecified: Secondary | ICD-10-CM | POA: Diagnosis not present

## 2023-03-02 DIAGNOSIS — L659 Nonscarring hair loss, unspecified: Secondary | ICD-10-CM | POA: Diagnosis not present

## 2023-03-03 DIAGNOSIS — H40003 Preglaucoma, unspecified, bilateral: Secondary | ICD-10-CM | POA: Diagnosis not present

## 2023-03-03 DIAGNOSIS — Z961 Presence of intraocular lens: Secondary | ICD-10-CM | POA: Diagnosis not present

## 2023-05-25 DIAGNOSIS — L659 Nonscarring hair loss, unspecified: Secondary | ICD-10-CM | POA: Diagnosis not present

## 2023-05-25 DIAGNOSIS — L403 Pustulosis palmaris et plantaris: Secondary | ICD-10-CM | POA: Diagnosis not present

## 2023-06-02 DIAGNOSIS — H40003 Preglaucoma, unspecified, bilateral: Secondary | ICD-10-CM | POA: Diagnosis not present

## 2023-06-02 DIAGNOSIS — Z961 Presence of intraocular lens: Secondary | ICD-10-CM | POA: Diagnosis not present

## 2023-06-15 DIAGNOSIS — Z79899 Other long term (current) drug therapy: Secondary | ICD-10-CM | POA: Diagnosis not present

## 2023-06-15 DIAGNOSIS — E1165 Type 2 diabetes mellitus with hyperglycemia: Secondary | ICD-10-CM | POA: Diagnosis not present

## 2023-06-15 DIAGNOSIS — E039 Hypothyroidism, unspecified: Secondary | ICD-10-CM | POA: Diagnosis not present

## 2023-06-15 DIAGNOSIS — E559 Vitamin D deficiency, unspecified: Secondary | ICD-10-CM | POA: Diagnosis not present

## 2023-06-15 DIAGNOSIS — I10 Essential (primary) hypertension: Secondary | ICD-10-CM | POA: Diagnosis not present

## 2023-06-15 DIAGNOSIS — E782 Mixed hyperlipidemia: Secondary | ICD-10-CM | POA: Diagnosis not present

## 2023-07-05 DIAGNOSIS — L403 Pustulosis palmaris et plantaris: Secondary | ICD-10-CM | POA: Diagnosis not present

## 2023-07-05 DIAGNOSIS — L821 Other seborrheic keratosis: Secondary | ICD-10-CM | POA: Diagnosis not present

## 2023-07-05 DIAGNOSIS — L720 Epidermal cyst: Secondary | ICD-10-CM | POA: Diagnosis not present

## 2023-07-06 DIAGNOSIS — Z87891 Personal history of nicotine dependence: Secondary | ICD-10-CM | POA: Diagnosis not present

## 2023-09-26 DIAGNOSIS — D860 Sarcoidosis of lung: Secondary | ICD-10-CM | POA: Diagnosis not present

## 2023-09-26 DIAGNOSIS — M150 Primary generalized (osteo)arthritis: Secondary | ICD-10-CM | POA: Diagnosis not present

## 2023-10-14 DIAGNOSIS — E039 Hypothyroidism, unspecified: Secondary | ICD-10-CM | POA: Diagnosis not present

## 2023-10-14 DIAGNOSIS — Z0000 Encounter for general adult medical examination without abnormal findings: Secondary | ICD-10-CM | POA: Diagnosis not present

## 2023-10-14 DIAGNOSIS — E785 Hyperlipidemia, unspecified: Secondary | ICD-10-CM | POA: Diagnosis not present

## 2023-10-14 DIAGNOSIS — I10 Essential (primary) hypertension: Secondary | ICD-10-CM | POA: Diagnosis not present

## 2023-10-14 DIAGNOSIS — E559 Vitamin D deficiency, unspecified: Secondary | ICD-10-CM | POA: Diagnosis not present

## 2023-10-14 DIAGNOSIS — Z0001 Encounter for general adult medical examination with abnormal findings: Secondary | ICD-10-CM | POA: Diagnosis not present

## 2023-10-14 DIAGNOSIS — E1165 Type 2 diabetes mellitus with hyperglycemia: Secondary | ICD-10-CM | POA: Diagnosis not present

## 2023-10-19 DIAGNOSIS — I10 Essential (primary) hypertension: Secondary | ICD-10-CM | POA: Diagnosis not present

## 2023-12-06 DIAGNOSIS — M810 Age-related osteoporosis without current pathological fracture: Secondary | ICD-10-CM | POA: Diagnosis not present

## 2023-12-06 DIAGNOSIS — Z780 Asymptomatic menopausal state: Secondary | ICD-10-CM | POA: Diagnosis not present

## 2023-12-06 DIAGNOSIS — Z1231 Encounter for screening mammogram for malignant neoplasm of breast: Secondary | ICD-10-CM | POA: Diagnosis not present

## 2023-12-07 ENCOUNTER — Ambulatory Visit: Payer: BC Managed Care – PPO | Admitting: Family Medicine

## 2023-12-07 ENCOUNTER — Encounter: Payer: Self-pay | Admitting: Family Medicine

## 2023-12-07 VITALS — BP 136/90 | HR 88 | Temp 98.4°F | Ht 64.0 in | Wt 207.0 lb

## 2023-12-07 DIAGNOSIS — M810 Age-related osteoporosis without current pathological fracture: Secondary | ICD-10-CM | POA: Insufficient documentation

## 2023-12-07 DIAGNOSIS — E785 Hyperlipidemia, unspecified: Secondary | ICD-10-CM

## 2023-12-07 DIAGNOSIS — G4709 Other insomnia: Secondary | ICD-10-CM

## 2023-12-07 DIAGNOSIS — E039 Hypothyroidism, unspecified: Secondary | ICD-10-CM

## 2023-12-07 DIAGNOSIS — K219 Gastro-esophageal reflux disease without esophagitis: Secondary | ICD-10-CM

## 2023-12-07 DIAGNOSIS — I10 Essential (primary) hypertension: Secondary | ICD-10-CM

## 2023-12-07 DIAGNOSIS — G2581 Restless legs syndrome: Secondary | ICD-10-CM | POA: Diagnosis not present

## 2023-12-07 NOTE — Progress Notes (Signed)
 Patient Office Visit  Assessment & Plan:  Essential hypertension  Hyperlipidemia, unspecified hyperlipidemia type  RESTLESS LEGS SYNDROME  Osteoporosis, unspecified osteoporosis type, unspecified pathological fracture presence -     Ambulatory referral to Orthopedic Surgery  Hypothyroidism, unspecified type  Other insomnia  Gastroesophageal reflux disease without esophagitis  Osteoporosis of lumbar spine   DEXA scan report reviewed with the patient today.  Copy given.  Patient will see bone health clinic in [facility] to discuss other options.  Patient may be a good candidate for Prolia injections.  Patient will continue with calcium vitamin D weightbearing strength training. Recommend healthy diet i.e mediterranean/DASH diet, consistent exercise - 30 minutes 5 day per week, and gradual weight loss. Return in about 4 months (around 04/05/2024), or if symptoms worsen or fail to improve, for type 2 diabetes, depression, hyperlipidemia, hypertension.   Subjective:    Patient ID: Dorothy Williamson, female    DOB: 05-05-1961  Age: 63 y.o. MRN: [id number]  Chief Complaint  Patient presents with   Establish Care    HPI Follow up Type 2 diabetes.. Denies diarrhea, peripheral swelling, hypoglycemia, excessive thirst, excessive urination, vision fluctuation, fatigue. Making an effort on diet control and exercise. UTD on dilated eye exam. Using medication as prescribed without difficulty. Dexcom G7 was prescribed today.  Patient states blood sugars have been in normal range of 100 220.  Patient has not had any low blood sugar readings or extremely elevated.  Follow up hyperlipidemia. Denies muscle aches or other difficulty tolerating statin therapy. Aware of need for diet control, exercise.  Patient is cooking at home and is walking consistently.  Follow up on hypertension. Using antihypertensive medication as prescribed without difficulty. Denies associated signs and symptoms including  chest pain, shortness of breath, cough, headache, peripheral swelling, cramps, spasms, palpitations. Voices understanding of the potential for interference with blood pressure control with substances including high sodium intake, decongestants, herbal supplements, weight loss supplements, nutritional supplements. Blood pressures at home are less than 140/90.  Patient did not take her blood pressure medication today. Pulmonary sarcoidosis- pt seeing [facility]  Patient started on meloxicam 50 mg once a day about 2 months ago.  Patient does think it helps.  Patient is aware that she needs to take it with food and that it can affect her kidneys. Low Potassium: Pt is taking Klor-Con 10 MEQ daily without any complications.   INSOMNIA: Pt is still taking Ambien  10 mg qhs without any complications. Does not have cognitive deficits or a.m. grogginess.  Gastroesophageal reflux disease without esophagitis: Pt is taking Pantoprazole  40 mg daily without any complications   Osteoporosis:Pt is still taking Fosamax 70 mg weekly without any complications. Pt taking calcium and Vit D. DEXA scan yesterday reviewed. T score negative- copy given and reviewed today.   Vitamin D deficiency: Pt is taking vitamin d daily. Will check vitamin d today.  HYPOTHYROIDISM:Pt is taking Levothryroxine 112 mcg daily without any complications.   Former Smoker: Pt smoked for 11 years, pt is candidate for Lung Cancer Screening Scan.  Breast Cancer Screening: Pt is up to date with mammogram,had it yesterday Neuropathy: Pt is taking Gabapentin  300 mg -- 6 capsules per day without any complications. Patient has to take the capsules not the tablets. She does not want to change gabapentin  to 600 mg tablets which would make her take fewer per day. Patient prefers a capsule   The 10-year ASCVD risk score (Arnett DK, et al., 2019) is:  16.6%    ROS    Objective:    BP (!) 136/90 (BP Location: Left Arm)   Pulse 88    Temp 98.4 F (36.9 C)   Ht 5\' 4"  (1.626 m)   Wt 207 lb (93.9 kg)   SpO2 99%   BMI 35.53 kg/m  BP Readings from Last 3 Encounters:  12/07/23 (!) 136/90  04/03/21 135/84  03/23/21 (!) 151/100   Wt Readings from Last 3 Encounters:  12/07/23 207 lb (93.9 kg)  04/03/21 204 lb 5.9 oz (92.7 kg)  06/19/20 (!) 206 lb (93.4 kg)    Physical Exam Vitals and nursing note reviewed.  Constitutional:      Appearance: Normal appearance.  HENT:     Head: Normocephalic.     Right Ear: Tympanic membrane, ear canal and external ear normal.     Left Ear: Tympanic membrane, ear canal and external ear normal.  Eyes:     Extraocular Movements: Extraocular movements intact.     Conjunctiva/sclera: Conjunctivae normal.     Pupils: Pupils are equal, round, and reactive to light.  Cardiovascular:     Rate and Rhythm: Normal rate and regular rhythm.     Heart sounds: Normal heart sounds.  Pulmonary:     Effort: Pulmonary effort is normal.     Breath sounds: Normal breath sounds.  Musculoskeletal:     Right lower leg: No edema.     Left lower leg: No edema.  Neurological:     General: No focal deficit present.     Mental Status: She is alert and oriented to person, place, and time.  Psychiatric:        Mood and Affect: Mood normal.        Behavior: Behavior normal.        Thought Content: Thought content normal.        Judgment: Judgment normal.      No results found for any visits on 12/07/23.

## 2023-12-13 ENCOUNTER — Other Ambulatory Visit: Payer: Self-pay | Admitting: Family Medicine

## 2023-12-13 MED ORDER — ZOLPIDEM TARTRATE 10 MG PO TABS: 10.0000 mg | ORAL_TABLET | Freq: Every day | ORAL | 5 refills | Status: DC

## 2023-12-13 NOTE — Telephone Encounter (Signed)
Copied from CRM [id number]. Topic: Clinical - Medication Refill >> Dec 13, 2023  9:25 AM Dollene Primrose wrote: Most Recent Primary Care Visit:  Provider: Bernadette Hoit  Department: [facility]  Visit Type: NEW PT - OFFICE VISIT  Date: 12/07/2023  Medication:  zolpidem (AMBIEN) 10 MG tablet   Has the patient contacted their pharmacy? Yes-no refills (Agent: If no, request that the patient contact the pharmacy for the refill. If patient does not wish to contact the pharmacy document the reason why and proceed with request.) (Agent: If yes, when and what did the pharmacy advise?)  Is this the correct pharmacy for this prescription? yes If no, delete pharmacy and type the correct one.  This is the patient's preferred pharmacy:  [id number] - [place] - [address] [place] [address] Phone: [phone] Fax: [phone]   Has the prescription been filled recently? yes  Is the patient out of the medication? yes  Has the patient been seen for an appointment in the last year OR does the patient have an upcoming appointment? yes  Can we respond through MyChart? yes  Agent: Please be advised that Rx refills may take up to 3 business days. We ask that you follow-up with your pharmacy.

## 2023-12-19 DIAGNOSIS — H40003 Preglaucoma, unspecified, bilateral: Secondary | ICD-10-CM | POA: Diagnosis not present

## 2023-12-19 DIAGNOSIS — Z961 Presence of intraocular lens: Secondary | ICD-10-CM | POA: Diagnosis not present

## 2023-12-19 DIAGNOSIS — E119 Type 2 diabetes mellitus without complications: Secondary | ICD-10-CM | POA: Diagnosis not present

## 2024-03-10 ENCOUNTER — Other Ambulatory Visit: Payer: Self-pay | Admitting: Family Medicine

## 2024-03-10 DIAGNOSIS — I10 Essential (primary) hypertension: Secondary | ICD-10-CM

## 2024-03-12 NOTE — Telephone Encounter (Signed)
 Requested medications are due for refill today.  yes  Requested medications are on the active medications list.  yes  Last refill. 08/24/2018  Future visit scheduled.   yes  Notes to clinic.  Medication is historical.    Requested Prescriptions  Pending Prescriptions Disp Refills   hydrochlorothiazide (HYDRODIURIL) 25 MG tablet [Pharmacy Med Name: HYDROCHLOROTHIAZIDE 25 MG TAB] 30 tablet     Sig: TAKE 1 TABLET (25 MG TOTAL) BY MOUTH DAILY.     Cardiovascular: Diuretics - Thiazide Failed - 03/12/2024  1:44 PM      Failed - Cr in normal range and within 180 days    Creatinine, Ser  Date Value Ref Range Status  03/31/2021 0.86 0.44 - 1.00 mg/dL Final         Failed - K in normal range and within 180 days    Potassium  Date Value Ref Range Status  03/31/2021 3.9 3.5 - 5.1 mmol/L Final         Failed - Na in normal range and within 180 days    Sodium  Date Value Ref Range Status  03/31/2021 140 135 - 145 mmol/L Final         Failed - Last BP in normal range    BP Readings from Last 1 Encounters:  12/07/23 (!) 136/90         Failed - Valid encounter within last 6 months    Recent Outpatient Visits           3 months ago Essential hypertension    [facility] Amadeo June, MD

## 2024-03-15 ENCOUNTER — Other Ambulatory Visit: Payer: Self-pay

## 2024-03-15 NOTE — Telephone Encounter (Signed)
 Prescription Request  03/15/2024  LOV: 12/07/23  What is the name of the medication or equipment? carbidopa-levodopa (SINEMET IR) 25-100 MG tablet [[id number]]   Have you contacted your pharmacy to request a refill? Yes   Which pharmacy would you like this sent to?  [id number] [place], [place] - [address] [place] [address] Phone: [phone] Fax: [phone]    Patient notified that their request is being sent to the clinical staff for review and that they should receive a response within 2 business days.   Please advise at [facility] [phone]

## 2024-03-16 NOTE — Telephone Encounter (Signed)
 Requested medication (s) are due for refill today: routing for review  Requested medication (s) are on the active medication list: yes  Last refill:  03/23/17  Future visit scheduled: yes  Notes to clinic:  Unable to refill per protocol, last refill by another/historical provider.      Requested Prescriptions  Pending Prescriptions Disp Refills   carbidopa-levodopa (SINEMET IR) 25-100 MG tablet      Sig: Take 2 tablets by mouth at bedtime.     Neurology:  Parkinsonian Agents 2 Failed - 03/16/2024  9:39 AM      Failed - WBC in normal range and within 360 days    WBC  Date Value Ref Range Status  05/05/2019 7.8 4.0 - 10.5 K/uL Final         Failed - PLT in normal range and within 360 days    Platelets  Date Value Ref Range Status  05/05/2019 303 150 - 400 K/uL Final    Comment:    REPEATED TO VERIFY         Failed - HGB in normal range and within 360 days    Hemoglobin  Date Value Ref Range Status  05/05/2019 15.1 (H) 12.0 - 15.0 g/dL Final         Failed - HCT in normal range and within 360 days    HCT  Date Value Ref Range Status  05/05/2019 45.9 36.0 - 46.0 % Final         Failed - Cr in normal range and within 360 days    Creatinine, Ser  Date Value Ref Range Status  03/31/2021 0.86 0.44 - 1.00 mg/dL Final         Failed - AST in normal range and within 360 days    AST  Date Value Ref Range Status  05/05/2019 27 15 - 41 U/L Final         Failed - ALT in normal range and within 360 days    ALT  Date Value Ref Range Status  05/05/2019 22 0 - 44 U/L Final         Failed - Last BP in normal range    BP Readings from Last 1 Encounters:  12/07/23 (!) 136/90         Failed - Valid encounter within last 12 months    Recent Outpatient Visits           3 months ago Essential hypertension   [facility] Amadeo June, MD

## 2024-03-19 ENCOUNTER — Telehealth: Payer: Self-pay | Admitting: Family Medicine

## 2024-03-19 NOTE — Telephone Encounter (Signed)
 Copied from CRM [id number]. Topic: Clinical - Medication Refill >> Mar 19, 2024  3:51 PM Baldemar Lev wrote: Most Recent Primary Care Visit:  Provider: Amadeo June  Department: [facility]  Visit Type: NEW PT - OFFICE VISIT  Date: 12/07/2023  Medication: POTASSIUM PO amLODipine (NORVASC) 10 MG tablet  metFORMIN (GLUCOPHAGE) 500 MG table carbidopa-levodopa (SINEMET IR) 25-100 MG tablet pantoprazole (PROTONIX) 20 MG tablet levothyroxine (SYNTHROID, LEVOTHROID) 137 MCG tablet zolpidem  (AMBIEN ) 10 MG tablet gabapentin (NEURONTIN) 300 MG capsule   Has the patient contacted their pharmacy? Yes (Agent: If no, request that the patient contact the pharmacy for the refill. If patient does not wish to contact the pharmacy document the reason why and proceed with request.) (Agent: If yes, when and what did the pharmacy advise?)  Is this the correct pharmacy for this prescription? Yes If no, delete pharmacy and type the correct one.  This is the patient's preferred pharmacy:  [id number], [place] - [address] [place] [address] Phone: [phone] Fax: [phone]   Has the prescription been filled recently? Yes  Is the patient out of the medication? No  Has the patient been seen for an appointment in the last year OR does the patient have an upcoming appointment? Yes  Can we respond through MyChart? Yes  Agent: Please be advised that Rx refills may take up to 3 business days. We ask that you follow-up with your pharmacy.

## 2024-03-20 MED ORDER — CARBIDOPA-LEVODOPA 25-100 MG PO TABS: 2.0000 | ORAL_TABLET | Freq: Every day | ORAL | 0 refills | Status: DC

## 2024-03-21 ENCOUNTER — Other Ambulatory Visit: Payer: Self-pay | Admitting: Family Medicine

## 2024-03-21 NOTE — Telephone Encounter (Signed)
 Requested medication (s) are due for refill today: routing for review  Requested medication (s) are on the active medication list: yes  Last refill:  multiple dates  Future visit scheduled: yes  Notes to clinic:  Unable to refill per protocol, last refill by another/historical provider.      Requested Prescriptions  Pending Prescriptions Disp Refills   amLODipine (NORVASC) 10 MG tablet      Sig: Take 1 tablet (10 mg total) by mouth daily.     Cardiovascular: Calcium Channel Blockers 2 Failed - 03/21/2024  4:02 PM      Failed - Last BP in normal range    BP Readings from Last 1 Encounters:  12/07/23 (!) 136/90         Failed - Valid encounter within last 6 months    Recent Outpatient Visits           3 months ago Essential hypertension   [place] [facility] Amadeo June, MD              Passed - Last Heart Rate in normal range    Pulse Readings from Last 1 Encounters:  12/07/23 88          metFORMIN (GLUCOPHAGE) 500 MG tablet      Sig: Take 1 tablet (500 mg total) by mouth 2 (two) times daily before a meal.     Endocrinology:  Diabetes - Biguanides Failed - 03/21/2024  4:02 PM      Failed - Cr in normal range and within 360 days    Creatinine, Ser  Date Value Ref Range Status  03/31/2021 0.86 0.44 - 1.00 mg/dL Final         Failed - HBA1C is between 0 and 7.9 and within 180 days    No results found for: "HGBA1C", "LABA1C"       Failed - eGFR in normal range and within 360 days    GFR calc Af Amer  Date Value Ref Range Status  05/05/2019 >60 >60 mL/min Final   GFR, Estimated  Date Value Ref Range Status  03/31/2021 >60 >60 mL/min Final    Comment:    (NOTE) Calculated using the CKD-EPI Creatinine Equation (2021)          Failed - B12 Level in normal range and within 720 days    No results found for: "VITAMINB12"       Failed - Valid encounter within last 6 months    Recent Outpatient Visits           3 months ago  Essential hypertension   [place] [facility] Amadeo June, MD              Failed - CBC within normal limits and completed in the last 12 months    WBC  Date Value Ref Range Status  05/05/2019 7.8 4.0 - 10.5 K/uL Final   RBC  Date Value Ref Range Status  05/05/2019 5.11 3.87 - 5.11 MIL/uL Final   Hemoglobin  Date Value Ref Range Status  05/05/2019 15.1 (H) 12.0 - 15.0 g/dL Final   HCT  Date Value Ref Range Status  05/05/2019 45.9 36.0 - 46.0 % Final   MCHC  Date Value Ref Range Status  05/05/2019 32.9 30.0 - 36.0 g/dL Final   [facility]  Date Value Ref Range Status  05/05/2019 29.5 26.0 - 34.0 pg Final   MCV  Date Value Ref Range Status  05/05/2019 89.8 80.0 -  100.0 fL Final   No results found for: "PLTCOUNTKUC", "LABPLAT", "POCPLA" RDW  Date Value Ref Range Status  05/05/2019 15.0 11.5 - 15.5 % Final          carbidopa-levodopa (SINEMET IR) 25-100 MG tablet      Sig: Take 2 tablets by mouth at bedtime.     Neurology:  Parkinsonian Agents 2 Failed - 03/21/2024  4:02 PM      Failed - WBC in normal range and within 360 days    WBC  Date Value Ref Range Status  05/05/2019 7.8 4.0 - 10.5 K/uL Final         Failed - PLT in normal range and within 360 days    Platelets  Date Value Ref Range Status  05/05/2019 303 150 - 400 K/uL Final    Comment:    REPEATED TO VERIFY         Failed - HGB in normal range and within 360 days    Hemoglobin  Date Value Ref Range Status  05/05/2019 15.1 (H) 12.0 - 15.0 g/dL Final         Failed - HCT in normal range and within 360 days    HCT  Date Value Ref Range Status  05/05/2019 45.9 36.0 - 46.0 % Final         Failed - Cr in normal range and within 360 days    Creatinine, Ser  Date Value Ref Range Status  03/31/2021 0.86 0.44 - 1.00 mg/dL Final         Failed - AST in normal range and within 360 days    AST  Date Value Ref Range Status  05/05/2019 27 15 - 41 U/L Final         Failed -  ALT in normal range and within 360 days    ALT  Date Value Ref Range Status  05/05/2019 22 0 - 44 U/L Final         Failed - Last BP in normal range    BP Readings from Last 1 Encounters:  12/07/23 (!) 136/90         Failed - Valid encounter within last 12 months    Recent Outpatient Visits           3 months ago Essential hypertension   [place] [facility] Medicine Amadeo June, MD               pantoprazole (PROTONIX) 20 MG tablet      Sig: Take 1 tablet (20 mg total) by mouth daily.     Gastroenterology: Proton Pump Inhibitors Failed - 03/21/2024  4:02 PM      Failed - Valid encounter within last 12 months    Recent Outpatient Visits           3 months ago Essential hypertension   [place] [facility] Medicine Amadeo June, MD               levothyroxine (SYNTHROID) 137 MCG tablet      Sig: Take 1 tablet (137 mcg total) by mouth daily before breakfast.     Endocrinology:  Hypothyroid Agents Failed - 03/21/2024  4:02 PM      Failed - TSH in normal range and within 360 days    No results found for: "TSH", "POCTSH", "TSHREFLEX"       Failed - Valid encounter within last 12 months    Recent Outpatient Visits  3 months ago Essential hypertension   [place] [facility] Medicine Amadeo June, MD               zolpidem  (AMBIEN ) 10 MG tablet 30 tablet 5    Sig: Take 1 tablet (10 mg total) by mouth at bedtime.     Not Delegated - Psychiatry:  Anxiolytics/Hypnotics Failed - 03/21/2024  4:02 PM      Failed - This refill cannot be delegated      Failed - Urine Drug Screen completed in last 360 days      Failed - Valid encounter within last 6 months    Recent Outpatient Visits           3 months ago Essential hypertension   [facility] Medicine Amadeo June, MD               gabapentin (NEURONTIN) 300 MG capsule      Sig: Take 1 capsule (300 mg total) by mouth 3 (three)  times daily.     Neurology: Anticonvulsants - gabapentin Failed - 03/21/2024  4:02 PM      Failed - Cr in normal range and within 360 days    Creatinine, Ser  Date Value Ref Range Status  03/31/2021 0.86 0.44 - 1.00 mg/dL Final         Failed - Valid encounter within last 12 months    Recent Outpatient Visits           3 months ago Essential hypertension   [facility] Amadeo June, MD              Passed - Completed PHQ-2 or PHQ-9 in the last 360 days

## 2024-03-23 MED ORDER — GABAPENTIN 300 MG PO CAPS: 300.0000 mg | ORAL_CAPSULE | Freq: Three times a day (TID) | ORAL | 1 refills | Status: DC

## 2024-03-23 MED ORDER — CARBIDOPA-LEVODOPA 25-100 MG PO TABS: 2.0000 | ORAL_TABLET | Freq: Every day | ORAL | 1 refills | Status: DC

## 2024-03-23 MED ORDER — METFORMIN HCL 500 MG PO TABS: 500.0000 mg | ORAL_TABLET | Freq: Two times a day (BID) | ORAL | 1 refills | Status: DC

## 2024-03-23 MED ORDER — PANTOPRAZOLE SODIUM 20 MG PO TBEC: 20.0000 mg | DELAYED_RELEASE_TABLET | Freq: Every day | ORAL | 1 refills | Status: DC

## 2024-03-23 MED ORDER — LEVOTHYROXINE SODIUM 137 MCG PO TABS: 137.0000 ug | ORAL_TABLET | Freq: Every day | ORAL | 1 refills | Status: DC

## 2024-03-23 MED ORDER — AMLODIPINE BESYLATE 10 MG PO TABS: 10.0000 mg | ORAL_TABLET | Freq: Every day | ORAL | 1 refills | Status: DC

## 2024-03-23 MED ORDER — ZOLPIDEM TARTRATE 10 MG PO TABS: 10.0000 mg | ORAL_TABLET | Freq: Every day | ORAL | 5 refills | Status: DC

## 2024-04-10 ENCOUNTER — Encounter: Payer: Self-pay | Admitting: Family Medicine

## 2024-04-10 ENCOUNTER — Ambulatory Visit: Payer: BC Managed Care – PPO | Admitting: Family Medicine

## 2024-04-10 VITALS — BP 122/80 | HR 83 | Temp 98.5°F | Ht 64.0 in | Wt 198.1 lb

## 2024-04-10 DIAGNOSIS — E039 Hypothyroidism, unspecified: Secondary | ICD-10-CM

## 2024-04-10 DIAGNOSIS — G2581 Restless legs syndrome: Secondary | ICD-10-CM | POA: Diagnosis not present

## 2024-04-10 DIAGNOSIS — M810 Age-related osteoporosis without current pathological fracture: Secondary | ICD-10-CM | POA: Diagnosis not present

## 2024-04-10 DIAGNOSIS — G4709 Other insomnia: Secondary | ICD-10-CM

## 2024-04-10 DIAGNOSIS — E1165 Type 2 diabetes mellitus with hyperglycemia: Secondary | ICD-10-CM

## 2024-04-10 DIAGNOSIS — I10 Essential (primary) hypertension: Secondary | ICD-10-CM

## 2024-04-10 DIAGNOSIS — E785 Hyperlipidemia, unspecified: Secondary | ICD-10-CM | POA: Diagnosis not present

## 2024-04-10 DIAGNOSIS — Z800 Family history of malignant neoplasm of digestive organs: Secondary | ICD-10-CM

## 2024-04-10 DIAGNOSIS — Z7984 Long term (current) use of oral hypoglycemic drugs: Secondary | ICD-10-CM

## 2024-04-10 DIAGNOSIS — K219 Gastro-esophageal reflux disease without esophagitis: Secondary | ICD-10-CM

## 2024-04-10 NOTE — Progress Notes (Signed)
 Patient Office Visit  Assessment & Plan:  Essential hypertension -     CBC with Differential/Platelet -     Comprehensive metabolic panel with GFR  Hypothyroidism, unspecified type -     TSH  Hyperlipidemia, unspecified hyperlipidemia type -     Lipid panel  RESTLESS LEGS SYNDROME  Other insomnia  Gastroesophageal reflux disease without esophagitis  Family history of colon cancer -     Ambulatory referral to Gastroenterology  Type 2 diabetes mellitus with hyperglycemia, without long-term current use of insulin (HCC) -     Hemoglobin A1c  Osteoporosis of lumbar spine -     VITAMIN D 25 Hydroxy (Vit-D Deficiency, Fractures)   Assessment and Plan    Thyroid disorder Prescription error identified for levothyroxine  137 mcg. TSH levels previously low, indicating possible overmedication. No symptoms of overmedication reported. - Recheck thyroid function tests to determine appropriate levothyroxine  dosage.  Type 2 diabetes mellitus Metformin  500 mg twice daily with occasional diarrhea noted.  Osteoporosis Possible contributor to fluctuating alkaline phosphatase levels. - Recheck alkaline phosphatase levels.  Gastroesophageal reflux disease (GERD) Managed with Protonix  as needed. No new symptoms reported.  Sinus arrhythmia Regularly monitored with personal device. No evidence of atrial fibrillation. Previous EKGs stable.  Insomnia Sleeping well with medication, achieving five hours per night. Discussed Ambien  and dementia risk.  General Health Maintenance Up to date on tetanus immunization. Due for colonoscopy due to family history and previous polyps. - Consider shingles vaccination despite previous shingles infection. - Schedule colonoscopy due to family history of colon cancer and previous polyps.      RTC 4-6 mos or sooner if nec.  Recommend healthy diet i.e mediterranean/DASH diet, consistent exercise - 30 minutes 5 day per week, and gradual weight  loss.  No follow-ups on file.   Subjective:     Patient ID: Dorothy Williamson, female    DOB: Apr 19, 1961  Age: 63 y.o. MRN: [id number]  Chief Complaint  Patient presents with   Medical Management of Chronic Issues    HPI Discussed the use of AI scribe software for clinical note transcription with the patient, who gave verbal consent to proceed.  History of Present Illness   Dorothy Williamson is a 63 year old female with hypothyroidism who presents for medication management and follow-up.  She was previously taking 112 micrograms of her thyroid medication but noticed her blood work showed low levels. She has not started taking the 137 micrograms dose that was mistakenly prescribed. She feels 'out of whack' and attributes it to possible menopause or high thyroid levels. No anxiety or jitteriness.  She engages in regular physical activity, walking on the treadmill for about 30 minutes, five days a week. She has lost weight, going from 207 pounds to 198 pounds. She is eating healthy and her blood pressure is reportedly good.  She has a history of shingles and is hesitant about receiving the shingles vaccine due to a previous adverse reaction to a pneumonia vaccine, which caused swelling and a rash.  She takes Protonix  as needed for reflux and sleeps well on her current sleep medication, although she only gets about five hours of sleep per night.  She is taking metformin , two tablets per day, one in the morning and one at night. She has been on metformin  for two years and occasionally experiences diarrhea, though she is unsure if it is related to the medication.  She has a family history of colon cancer; her younger brother died of colon cancer  at age 29. She has had polyps in the past and believes she is on a two to three-year colonoscopy plan, though her last colonoscopy was in 2020.  She reports having a sinus arrhythmia, which she monitors using her phone and Apple Watch. She has had  multiple EKGs in the past, with no significant changes noted.  Her alkaline phosphatase levels were noted to be high. She had an eye exam on March 01, 2024, with no abnormal findings reported.     Physical Exam MEASUREMENTS: Weight- 198. CARDIOVASCULAR: Irregular heartbeat. Results LABS Alkaline Phosphatase: high (slightly elevated)  DIAGNOSTIC EKG: No significant change (09/2023) Assessment & Plan Thyroid disorder- patient only taking 112 micrograms per day (137 micrograms also listed but has not taken this) Prescription error identified for levothyroxine  137 mcg. TSH levels previously low, indicating possible overmedication. No symptoms of overmedication reported. - Recheck thyroid function tests to determine appropriate levothyroxine  dosage.  Type 2 diabetes mellitus- pt has been working out on treadmill Metformin  500 mg twice daily with occasional diarrhea noted. Hyperlipidemia-denies unusual muscle aches or muscle cramps or difficulty tolerating statin therapy.  Aware of need for diet control, exercise and healthy eating.  Patient is aware not to consume grapefruit juice or pomegranate juice.  Osteoporosis Possible contributor to fluctuating alkaline phosphatase levels. - Recheck alkaline phosphatase levels.  Gastroesophageal reflux disease (GERD) Managed with Protonix  as needed. No new symptoms reported.  Sinus arrhythmia Regularly monitored with personal device. No evidence of atrial fibrillation. Previous EKGs stable.  Insomnia Sleeping well with medication, achieving five hours per night. Discussed Ambien  and dementia risk. Restless leg- pt also taking Sinemet  at night time.  General Health Maintenance Up to date on tetanus immunization. Due for colonoscopy due to family history and previous polyps. - Consider shingles vaccination despite previous shingles infection. Patient declines Shingrix today.  - Schedule colonoscopy due to family history of colon cancer and  previous polyps.     The 10-year ASCVD risk score (Arnett DK, et al., 2019) is: 12.3%  Past Medical History:  Diagnosis Date   Arthritis    Chronic knee pain    Diabetes mellitus without complication (HCC)    GERD (gastroesophageal reflux disease)    Hypercholesteremia    Hypertension    Nerve pain    Sleep apnea    does not use CPAP   Thyroid disease    Past Surgical History:  Procedure Laterality Date   ABDOMINAL HYSTERECTOMY     BREAST SURGERY     CARDIOVASCULAR STRESS TEST Bilateral 2017   normal results   CARPAL TUNNEL RELEASE     CHOLECYSTECTOMY     ENDOVENOUS ABLATION SAPHENOUS VEIN W/ LASER Right 03/20/2020   endovenous laser ablation right greater saphenous vein and stab phlebectomy 2 incisions right leg by Kirtland Perfect MD    EXCISION METACARPAL MASS Right 04/03/2021   Procedure: EXCISION OF MASS RIGHT RING FINGER;  Surgeon: Brunilda Capra, MD;  Location: [facility];  Service: Orthopedics;  Laterality: Right;  30 MIN   JOINT REPLACEMENT     Social History   Tobacco Use   Smoking status: Former   Smokeless tobacco: Never  Vaping Use   Vaping status: Never Used  Substance Use Topics   Alcohol use: No   Drug use: No   Family History  Problem Relation Age of Onset   Hypertension Mother    Hypertension Father    Diabetes Father    Cancer Father    Colon cancer Brother  No Known Allergies  ROS    Objective:    BP 122/80   Pulse 83   Temp 98.5 F (36.9 C)   Ht 5\' 4"  (1.626 m)   Wt 198 lb 2 oz (89.9 kg)   SpO2 99%   BMI 34.01 kg/m  BP Readings from Last 3 Encounters:  04/10/24 122/80  12/07/23 (!) 136/90  04/03/21 135/84   Wt Readings from Last 3 Encounters:  04/10/24 198 lb 2 oz (89.9 kg)  12/07/23 207 lb (93.9 kg)  04/03/21 204 lb 5.9 oz (92.7 kg)    Physical Exam Vitals and nursing note reviewed.  Constitutional:      Appearance: Normal appearance.  HENT:     Head: Normocephalic.     Right Ear: Tympanic membrane,  ear canal and external ear normal.     Left Ear: Tympanic membrane, ear canal and external ear normal.  Eyes:     Extraocular Movements: Extraocular movements intact.     Pupils: Pupils are equal, round, and reactive to light.  Cardiovascular:     Rate and Rhythm: Normal rate. Rhythm irregular.     Heart sounds: Normal heart sounds.  Pulmonary:     Effort: Pulmonary effort is normal.     Breath sounds: Normal breath sounds. No wheezing.  Musculoskeletal:     Right lower leg: No edema.     Left lower leg: No edema.  Neurological:     General: No focal deficit present.     Mental Status: She is alert and oriented to person, place, and time.  Psychiatric:        Mood and Affect: Mood normal.        Behavior: Behavior normal.        Thought Content: Thought content normal.        Judgment: Judgment normal.      Results for orders placed or performed in visit on 04/10/24  HM PAP SMEAR  Result Value Ref Range   HM Pap smear negative   HM HEPATITIS C SCREENING LAB  Result Value Ref Range   HM Hepatitis Screen Negative-Validated

## 2024-04-11 LAB — COMPREHENSIVE METABOLIC PANEL WITH GFR
AG Ratio: 1.7 (calc) (ref 1.0–2.5)
ALT: 12 U/L (ref 6–29)
AST: 13 U/L (ref 10–35)
Albumin: 4.3 g/dL (ref 3.6–5.1)
Alkaline phosphatase (APISO): 97 U/L (ref 37–153)
BUN: 10 mg/dL (ref 7–25)
CO2: 31 mmol/L (ref 20–32)
Calcium: 9.5 mg/dL (ref 8.6–10.4)
Chloride: 100 mmol/L (ref 98–110)
Creat: 0.72 mg/dL (ref 0.50–1.05)
Globulin: 2.5 g/dL (ref 1.9–3.7)
Glucose, Bld: 149 mg/dL — ABNORMAL HIGH (ref 65–99)
Potassium: 4.1 mmol/L (ref 3.5–5.3)
Sodium: 141 mmol/L (ref 135–146)
Total Bilirubin: 0.7 mg/dL (ref 0.2–1.2)
Total Protein: 6.8 g/dL (ref 6.1–8.1)
eGFR: 96 mL/min/{1.73_m2} (ref >=60)

## 2024-04-11 LAB — HEMOGLOBIN A1C
Hgb A1c MFr Bld: 8.2 % — ABNORMAL HIGH (ref <5.7)
Mean Plasma Glucose: 189 mg/dL
eAG (mmol/L): 10.4 mmol/L

## 2024-04-11 LAB — VITAMIN D 25 HYDROXY (VIT D DEFICIENCY, FRACTURES): Vit D, 25-Hydroxy: 73 ng/mL (ref 30–100)

## 2024-04-11 LAB — CBC WITH DIFFERENTIAL/PLATELET
Absolute Lymphocytes: 2405 {cells}/uL (ref 850–3900)
Absolute Monocytes: 445 {cells}/uL (ref 200–950)
Basophils Absolute: 51 {cells}/uL (ref 0–200)
Basophils Relative: 0.9 %
Eosinophils Absolute: 228 {cells}/uL (ref 15–500)
Eosinophils Relative: 4 %
HCT: 48.5 % — ABNORMAL HIGH (ref 35.0–45.0)
Hemoglobin: 15 g/dL (ref 11.7–15.5)
MCH: 28.8 pg (ref 27.0–33.0)
MCHC: 30.9 g/dL — ABNORMAL LOW (ref 32.0–36.0)
MCV: 93.1 fL (ref 80.0–100.0)
MPV: 10.4 fL (ref 7.5–12.5)
Monocytes Relative: 7.8 %
Neutro Abs: 2571 {cells}/uL (ref 1500–7800)
Neutrophils Relative %: 45.1 %
Platelets: 342 10*3/uL (ref 140–400)
RBC: 5.21 10*6/uL — ABNORMAL HIGH (ref 3.80–5.10)
RDW: 13.8 % (ref 11.0–15.0)
Total Lymphocyte: 42.2 %
WBC: 5.7 10*3/uL (ref 3.8–10.8)

## 2024-04-11 LAB — LIPID PANEL
Cholesterol: 185 mg/dL (ref <200)
HDL: 54 mg/dL (ref >=50)
LDL Cholesterol (Calc): 110 mg/dL — ABNORMAL HIGH
Non-HDL Cholesterol (Calc): 131 mg/dL — ABNORMAL HIGH (ref <130)
Total CHOL/HDL Ratio: 3.4 (calc) (ref <5.0)
Triglycerides: 106 mg/dL (ref <150)

## 2024-04-11 LAB — TSH: TSH: 0.08 m[IU]/L — ABNORMAL LOW (ref 0.40–4.50)

## 2024-04-13 ENCOUNTER — Ambulatory Visit: Payer: Self-pay | Admitting: Family Medicine

## 2024-04-13 ENCOUNTER — Other Ambulatory Visit: Payer: Self-pay

## 2024-04-13 ENCOUNTER — Telehealth: Payer: Self-pay

## 2024-04-13 MED ORDER — LEVOTHYROXINE SODIUM 100 MCG PO TABS: 100.0000 ug | ORAL_TABLET | Freq: Every day | ORAL | 3 refills | Status: DC

## 2024-04-13 NOTE — Telephone Encounter (Signed)
 Copied from CRM [id number]. Topic: Clinical - Prescription Issue >> Apr 13, 2024  3:24 PM Felizardo Hotter wrote: Reason for CRM: Pt called stated she forgot to ask Dr. Ferna How to give her a prescription for itching on her face. Please call pt at [phone]

## 2024-04-17 ENCOUNTER — Telehealth: Payer: Self-pay

## 2024-04-17 NOTE — Telephone Encounter (Signed)
 Copied from CRM [id number]. Topic: General - Other >> Apr 17, 2024 12:46 PM Elle L wrote: Reason for CRM: The patient is requesting to speak to Vallerie Gave, CMA regarding the prescription issue they were speaking about on MyChart. I reached out to the office who advised that she is out of the office today but the patient is requesting a call back tomorrow if possible.

## 2024-06-07 ENCOUNTER — Other Ambulatory Visit: Payer: Self-pay | Admitting: Family Medicine

## 2024-06-07 DIAGNOSIS — I10 Essential (primary) hypertension: Secondary | ICD-10-CM

## 2024-06-08 NOTE — Telephone Encounter (Signed)
 Requested Prescriptions  Pending Prescriptions Disp Refills   hydrochlorothiazide (HYDRODIURIL) 25 MG tablet [Pharmacy Med Name: HYDROCHLOROTHIAZIDE 25 MG TAB] 90 tablet 1    Sig: TAKE 1 TABLET (25 MG TOTAL) BY MOUTH DAILY.     Cardiovascular: Diuretics - Thiazide Passed - 06/08/2024 11:15 AM      Passed - Cr in normal range and within 180 days    Creat  Date Value Ref Range Status  04/10/2024 0.72 0.50 - 1.05 mg/dL Final         Passed - K in normal range and within 180 days    Potassium  Date Value Ref Range Status  04/10/2024 4.1 3.5 - 5.3 mmol/L Final         Passed - Na in normal range and within 180 days    Sodium  Date Value Ref Range Status  04/10/2024 141 135 - 146 mmol/L Final         Passed - Last BP in normal range    BP Readings from Last 1 Encounters:  04/10/24 122/80         Passed - Valid encounter within last 6 months    Recent Outpatient Visits           1 month ago Essential hypertension   [place] [facility] Aletha Bene, MD   6 months ago Essential hypertension   [place] [facility] Aletha Bene, MD

## 2024-06-11 ENCOUNTER — Telehealth: Payer: Self-pay | Admitting: Family Medicine

## 2024-06-11 ENCOUNTER — Telehealth: Payer: Self-pay

## 2024-06-11 ENCOUNTER — Other Ambulatory Visit: Payer: Self-pay | Admitting: Family Medicine

## 2024-06-11 ENCOUNTER — Other Ambulatory Visit: Payer: Self-pay

## 2024-06-11 NOTE — Telephone Encounter (Unsigned)
 Copied from CRM [id number]. Topic: Clinical - Prescription Issue >> Jun 11, 2024  4:19 PM Donee H wrote: Reason for CRM: Patient called to state was returning miss call regarding refill for Ambien .

## 2024-06-11 NOTE — Progress Notes (Signed)
 Pt called stating that [facility] did not have any [facility] on her Ambien . [facility] called [facility] to verify this [facility]. [facility].

## 2024-06-11 NOTE — Telephone Encounter (Signed)
 Copied from CRM [id number]. Topic: Clinical - Prescription Issue >> Jun 11, 2024  3:38 PM DeAngela L wrote: Reason for CRM: patient calling to ask if [facility], information [facility] and she states she [facility], after speaking with the pharmacy and would like to figure out which pharmacy the refill will be refilled at   Pt num [phone] (M) ok to leave a detailed message

## 2024-06-11 NOTE — Telephone Encounter (Unsigned)
 Copied from CRM [id number]. Topic: Clinical - Prescription Issue >> Jun 11, 2024  4:24 PM Donee H wrote: Reason for CRM: Patient called to state was returning miss call regarding refill for Ambien . Patient is stating called [facility] earlier and it was an issue with refill.

## 2024-06-13 MED ORDER — ZOLPIDEM TARTRATE 10 MG PO TABS: 10.0000 mg | ORAL_TABLET | Freq: Every day | ORAL | 5 refills | Status: DC

## 2024-06-13 NOTE — Telephone Encounter (Signed)
 No longer on levothyroxine  dose requested Requested Prescriptions  Pending Prescriptions Disp Refills   potassium chloride (KLOR-CON) 10 MEQ tablet [Pharmacy Med Name: POTASSIUM CL ER 10 MEQ TAB WAX] 90 tablet 0    Sig: TAKE 1 TABLET BY MOUTH EVERY DAY     Endocrinology:  Minerals - Potassium Supplementation Passed - 06/13/2024  3:37 PM      Passed - K in normal range and within 360 days    Potassium  Date Value Ref Range Status  04/10/2024 4.1 3.5 - 5.3 mmol/L Final         Passed - Cr in normal range and within 360 days    Creat  Date Value Ref Range Status  04/10/2024 0.72 0.50 - 1.05 mg/dL Final         Passed - Valid encounter within last 12 months    Recent Outpatient Visits           2 months ago Essential hypertension   [place] [facility] Aletha Bene, MD   6 months ago Essential hypertension   [place] [facility] Aletha Bene, MD               levothyroxine  (SYNTHROID ) 112 MCG tablet [Pharmacy Med Name: LEVOTHYROXINE  112 MCG TABLET] 90 tablet 0    Sig: TAKE 1 TABLET BY MOUTH EVERY DAY     Endocrinology:  Hypothyroid Agents Failed - 06/13/2024  3:37 PM      Failed - TSH in normal range and within 360 days    TSH  Date Value Ref Range Status  04/10/2024 0.08 (L) 0.40 - 4.50 mIU/L Final         Passed - Valid encounter within last 12 months    Recent Outpatient Visits           2 months ago Essential hypertension   [place] [facility] Aletha Bene, MD   6 months ago Essential hypertension   [place] [facility] Aletha Bene, MD               rosuvastatin (CRESTOR) 10 MG tablet [Pharmacy Med Name: ROSUVASTATIN CALCIUM 10 MG TAB] 90 tablet 0    Sig: TAKE 1 TABLET BY MOUTH EVERY DAY     Cardiovascular:  Antilipid - Statins 2 Failed - 06/13/2024  3:37 PM      Failed - Lipid Panel in normal range within the last 12 months    Cholesterol  Date Value Ref  Range Status  04/10/2024 185 <200 mg/dL Final   LDL Cholesterol (Calc)  Date Value Ref Range Status  04/10/2024 110 (H) mg/dL (calc) Final    Comment:    Reference range: <100 . Desirable range <100 mg/dL for primary prevention;   <70 mg/dL for patients with CHD or diabetic patients  with > or = 2 CHD risk factors. SABRA LDL-C is now calculated using the Martin-Hopkins  calculation, which is a validated novel method providing  better accuracy than the Friedewald equation in the  estimation of LDL-C.  Gladis APPLETHWAITE et al. SANDREA. 7986;689(80): 2061-2068  (http://education.[employer].com/faq/FAQ164)    HDL  Date Value Ref Range Status  04/10/2024 54 > OR = 50 mg/dL Final   Triglycerides  Date Value Ref Range Status  04/10/2024 106 <150 mg/dL Final         Passed - Cr in normal range and within 360 days    Creat  Date Value Ref Range Status  04/10/2024 0.72 0.50 - 1.05 mg/dL Final         Passed - Patient is not pregnant      Passed - Valid encounter within last 12 months    Recent Outpatient Visits           2 months ago Essential hypertension   [place] [facility] Aletha Bene, MD   6 months ago Essential hypertension   [place] [facility] Medicine Aletha Bene, MD

## 2024-06-13 NOTE — Telephone Encounter (Signed)
 Requested medication (s) are due for refill today: yes  Requested medication (s) are on the active medication list: yes  Last refill:  03/23/24  Future visit scheduled: yes  Notes to clinic:  Unable to refill per protocol, cannot delegate.      Requested Prescriptions  Pending Prescriptions Disp Refills   zolpidem  (AMBIEN ) 10 MG tablet 30 tablet 5    Sig: Take 1 tablet (10 mg total) by mouth at bedtime.     Not Delegated - Psychiatry:  Anxiolytics/Hypnotics Failed - 06/13/2024  3:29 PM      Failed - This refill cannot be delegated      Failed - Urine Drug Screen completed in last 360 days      Passed - Valid encounter within last 6 months    Recent Outpatient Visits           2 months ago Essential hypertension   [place] [facility] Aletha Bene, MD   6 months ago Essential hypertension   [place] [facility] Aletha Bene, MD

## 2024-06-13 NOTE — Telephone Encounter (Signed)
 Requested medication (s) are due for refill today - no  Requested medication (s) are on the active medication list - yes  Future visit scheduled -yes  Last refill: 03/23/24 #30 5RF  Notes to clinic: non delegated Rx  Requested Prescriptions  Pending Prescriptions Disp Refills   zolpidem  (AMBIEN ) 10 MG tablet [Pharmacy Med Name: Zolpidem  Tartrate 10 MG Oral Tablet] 30 tablet 0    Sig: TAKE 1 TABLET BY MOUTH AT BEDTIME     Not Delegated - Psychiatry:  Anxiolytics/Hypnotics Failed - 06/13/2024  1:42 PM      Failed - This refill cannot be delegated      Failed - Urine Drug Screen completed in last 360 days      Passed - Valid encounter within last 6 months    Recent Outpatient Visits           2 months ago Essential hypertension   [place] [facility] Aletha Bene, MD   6 months ago Essential hypertension   [place] [facility] Aletha Bene, MD                 Requested Prescriptions  Pending Prescriptions Disp Refills   zolpidem  (AMBIEN ) 10 MG tablet [Pharmacy Med Name: Zolpidem  Tartrate 10 MG Oral Tablet] 30 tablet 0    Sig: TAKE 1 TABLET BY MOUTH AT BEDTIME     Not Delegated - Psychiatry:  Anxiolytics/Hypnotics Failed - 06/13/2024  1:42 PM      Failed - This refill cannot be delegated      Failed - Urine Drug Screen completed in last 360 days      Passed - Valid encounter within last 6 months    Recent Outpatient Visits           2 months ago Essential hypertension   [place] [facility] Aletha Bene, MD   6 months ago Essential hypertension   [place] [facility] Medicine Aletha Bene, MD

## 2024-08-10 ENCOUNTER — Encounter: Payer: Self-pay | Admitting: *Deleted

## 2024-08-17 ENCOUNTER — Telehealth: Payer: Self-pay | Admitting: Family Medicine

## 2024-08-17 NOTE — Telephone Encounter (Signed)
 Left message on patient's voicemail to advise of change in appointment from 10/12/24 to 10/30/24 due to change in provider's schedule. Advised patient to call back to reschedule if new date/time conflicts with her availability.

## 2024-09-16 ENCOUNTER — Other Ambulatory Visit: Payer: Self-pay | Admitting: Family Medicine

## 2024-09-18 ENCOUNTER — Other Ambulatory Visit: Payer: Self-pay

## 2024-09-25 DIAGNOSIS — M150 Primary generalized (osteo)arthritis: Secondary | ICD-10-CM | POA: Diagnosis not present

## 2024-09-25 DIAGNOSIS — D860 Sarcoidosis of lung: Secondary | ICD-10-CM | POA: Diagnosis not present

## 2024-09-28 ENCOUNTER — Telehealth: Payer: Self-pay

## 2024-09-28 DIAGNOSIS — K219 Gastro-esophageal reflux disease without esophagitis: Secondary | ICD-10-CM | POA: Diagnosis not present

## 2024-09-28 DIAGNOSIS — R1030 Lower abdominal pain, unspecified: Secondary | ICD-10-CM | POA: Diagnosis not present

## 2024-09-28 DIAGNOSIS — Z800 Family history of malignant neoplasm of digestive organs: Secondary | ICD-10-CM | POA: Diagnosis not present

## 2024-09-28 DIAGNOSIS — Z860100 Personal history of colon polyps, unspecified: Secondary | ICD-10-CM | POA: Diagnosis not present

## 2024-09-28 NOTE — Telephone Encounter (Signed)
 Copied from CRM [id number]. Topic: Clinical - Request for Lab/Test Order >> Sep 28, 2024 12:24 PM Lonell PEDLAR wrote: Reason for CRM: Patient is requesting provider to order DEXA and CT chest

## 2024-10-06 ENCOUNTER — Other Ambulatory Visit: Payer: Self-pay | Admitting: Family Medicine

## 2024-10-07 ENCOUNTER — Other Ambulatory Visit: Payer: Self-pay | Admitting: Family Medicine

## 2024-10-09 NOTE — Telephone Encounter (Signed)
 Requested Prescriptions  Pending Prescriptions Disp Refills   potassium chloride (KLOR-CON) 10 MEQ tablet [Pharmacy Med Name: POTASSIUM CL ER 10 MEQ TAB WAX] 90 tablet 1    Sig: TAKE 1 TABLET BY MOUTH EVERY DAY     Endocrinology:  Minerals - Potassium Supplementation Passed - 10/09/2024 11:56 AM      Passed - K in normal range and within 360 days    Potassium  Date Value Ref Range Status  04/10/2024 4.1 3.5 - 5.3 mmol/L Final         Passed - Cr in normal range and within 360 days    Creat  Date Value Ref Range Status  04/10/2024 0.72 0.50 - 1.05 mg/dL Final         Passed - Valid encounter within last 12 months    Recent Outpatient Visits           6 months ago Essential hypertension   [place] [facility] Aletha Bene, MD   10 months ago Essential hypertension   [place] [facility] Aletha Bene, MD               rosuvastatin (CRESTOR) 10 MG tablet [Pharmacy Med Name: ROSUVASTATIN CALCIUM 10 MG TAB] 90 tablet 1    Sig: TAKE 1 TABLET BY MOUTH EVERY DAY     Cardiovascular:  Antilipid - Statins 2 Failed - 10/09/2024 11:56 AM      Failed - Lipid Panel in normal range within the last 12 months    Cholesterol  Date Value Ref Range Status  04/10/2024 185 <200 mg/dL Final   LDL Cholesterol (Calc)  Date Value Ref Range Status  04/10/2024 110 (H) mg/dL (calc) Final    Comment:    Reference range: <100 . Desirable range <100 mg/dL for primary prevention;   <70 mg/dL for patients with CHD or diabetic patients  with > or = 2 CHD risk factors. SABRA LDL-C is now calculated using the Martin-Hopkins  calculation, which is a validated novel method providing  better accuracy than the Friedewald equation in the  estimation of LDL-C.  Gladis APPLETHWAITE et al. SANDREA. 7986;689(80): 2061-2068  (http://education.[facility].com/faq/FAQ164)    HDL  Date Value Ref Range Status  04/10/2024 54 > OR = 50 mg/dL Final   Triglycerides  Date  Value Ref Range Status  04/10/2024 106 <150 mg/dL Final         Passed - Cr in normal range and within 360 days    Creat  Date Value Ref Range Status  04/10/2024 0.72 0.50 - 1.05 mg/dL Final         Passed - Patient is not pregnant      Passed - Valid encounter within last 12 months    Recent Outpatient Visits           6 months ago Essential hypertension   [place] [facility] Aletha Bene, MD   10 months ago Essential hypertension   [place] [facility] Aletha Bene, MD

## 2024-10-09 NOTE — Telephone Encounter (Signed)
 Requested medications are due for refill today.  unsure  Requested medications are on the active medications list.  yes  Last refill. 06/13/2024 #30 5rf  Future visit scheduled.   yes  Notes to clinic.  Refill not delegated. Last refill was printed.    Requested Prescriptions  Pending Prescriptions Disp Refills   zolpidem  (AMBIEN ) 10 MG tablet [Pharmacy Med Name: ZOLPIDEM  TARTRATE 10 MG TABLET] 30 tablet     Sig: TAKE 1 TABLET BY MOUTH EVERYDAY AT BEDTIME [FILL 03/26/24]     Not Delegated - Psychiatry:  Anxiolytics/Hypnotics Failed - 10/09/2024  2:07 PM      Failed - This refill cannot be delegated      Failed - Urine Drug Screen completed in last 360 days      Failed - Valid encounter within last 6 months    Recent Outpatient Visits           6 months ago Essential hypertension   [place] [facility] Aletha Bene, MD   10 months ago Essential hypertension   [place] [facility] Aletha Bene, MD

## 2024-10-10 ENCOUNTER — Other Ambulatory Visit: Payer: Self-pay | Admitting: Family Medicine

## 2024-10-10 NOTE — Telephone Encounter (Signed)
 Copied from CRM [id number]. Topic: Clinical - Medication Refill >> Oct 10, 2024 12:35 PM Zebedee SAUNDERS wrote: Medication: zolpidem  (AMBIEN ) 10 MG tablet  Has the patient contacted their pharmacy? Yes (Agent: If no, request that the patient contact the pharmacy for the refill. If patient does not wish to contact the pharmacy document the reason why and proceed with request.) (Agent: If yes, when and what did the pharmacy advise?)  This is the patient's preferred pharmacy:  [id number] [place], [place] - [address] [place] [address] Phone: [phone] Fax: [phone]  Is this the correct pharmacy for this prescription? Yes If no, delete pharmacy and type the correct one.   Has the prescription been filled recently? Yes  Is the patient out of the medication? Yes  Has the patient been seen for an appointment in the last year OR does the patient have an upcoming appointment? Yes  Can we respond through MyChart? Yes  Agent: Please be advised that Rx refills may take up to 3 business days. We ask that you follow-up with your pharmacy.

## 2024-10-11 ENCOUNTER — Telehealth: Payer: Self-pay | Admitting: Family Medicine

## 2024-10-11 NOTE — Telephone Encounter (Signed)
 Copied from CRM #[id number]. Topic: General - Other >> Oct 10, 2024 12:44 PM Zebedee SAUNDERS wrote: Reason for CRM: Pt returning Dorothy Williamson MARLA, LPN message from 10/01/2024 regarding chest x-ray due to pt's previous smoking which Dr. Aletha is aware of since pt has been a long existing pt.

## 2024-10-12 ENCOUNTER — Ambulatory Visit: Admitting: Family Medicine

## 2024-10-12 ENCOUNTER — Telehealth: Payer: Self-pay

## 2024-10-12 NOTE — Telephone Encounter (Signed)
 Aletha Bene, MD  Angelena Ronal Slater MARLA, LPN; Erika Elida RAMAN, CMA If she does qualify for CT scan lung cancer screening- please order this where patient prefers. Thank you, Rafaela

## 2024-10-12 NOTE — Telephone Encounter (Signed)
 Pt called to check the status of her refill. Reports that she only has 1 left. Please advise.

## 2024-10-12 NOTE — Telephone Encounter (Signed)
 Requested medication (s) are due for refill today: Yes  Requested medication (s) are on the active medication list: Yes  Last refill:  06/13/24  Future visit scheduled: Yes  Notes to clinic:  Not delegated.    Requested Prescriptions  Pending Prescriptions Disp Refills   zolpidem  (AMBIEN ) 10 MG tablet 30 tablet 5    Sig: Take 1 tablet (10 mg total) by mouth at bedtime.     Not Delegated - Psychiatry:  Anxiolytics/Hypnotics Failed - 10/12/2024  3:03 PM      Failed - This refill cannot be delegated      Failed - Urine Drug Screen completed in last 360 days      Failed - Valid encounter within last 6 months    Recent Outpatient Visits           6 months ago Essential hypertension   [place] [facility] Aletha Bene, MD   10 months ago Essential hypertension   [place] [facility] Aletha Bene, MD

## 2024-10-12 NOTE — Telephone Encounter (Signed)
 Requested medication (s) are due for refill today: yes  Requested medication (s) are on the active medication list: yes  Last refill:  n/a  Future visit scheduled: yes  Notes to clinic:  Unable to refill per protocol, cannot delegate.      Requested Prescriptions  Pending Prescriptions Disp Refills   zolpidem  (AMBIEN ) 10 MG tablet [Pharmacy Med Name: ZOLPIDEM  TARTRATE 10 MG TABLET] 30 tablet     Sig: TAKE 1 TABLET BY MOUTH EVERYDAY AT BEDTIME [FILL 03/26/24]     Not Delegated - Psychiatry:  Anxiolytics/Hypnotics Failed - 10/12/2024 12:38 PM      Failed - This refill cannot be delegated      Failed - Urine Drug Screen completed in last 360 days      Failed - Valid encounter within last 6 months    Recent Outpatient Visits           6 months ago Essential hypertension   [place] [facility] Aletha Bene, MD   10 months ago Essential hypertension   [place] [facility] Aletha Bene, MD

## 2024-10-14 MED ORDER — ZOLPIDEM TARTRATE 10 MG PO TABS: 10.0000 mg | ORAL_TABLET | Freq: Every day | ORAL | 5 refills | Status: DC

## 2024-10-19 ENCOUNTER — Encounter: Payer: Self-pay | Admitting: Family Medicine

## 2024-10-23 DIAGNOSIS — K635 Polyp of colon: Secondary | ICD-10-CM | POA: Diagnosis not present

## 2024-10-23 DIAGNOSIS — D120 Benign neoplasm of cecum: Secondary | ICD-10-CM | POA: Diagnosis not present

## 2024-10-23 DIAGNOSIS — K5750 Diverticulosis of both small and large intestine without perforation or abscess without bleeding: Secondary | ICD-10-CM | POA: Diagnosis not present

## 2024-10-23 DIAGNOSIS — Z860100 Personal history of colon polyps, unspecified: Secondary | ICD-10-CM | POA: Diagnosis not present

## 2024-10-23 DIAGNOSIS — Z09 Encounter for follow-up examination after completed treatment for conditions other than malignant neoplasm: Secondary | ICD-10-CM | POA: Diagnosis not present

## 2024-10-23 DIAGNOSIS — Z800 Family history of malignant neoplasm of digestive organs: Secondary | ICD-10-CM | POA: Diagnosis not present

## 2024-10-23 DIAGNOSIS — K648 Other hemorrhoids: Secondary | ICD-10-CM | POA: Diagnosis not present

## 2024-10-23 DIAGNOSIS — K5730 Diverticulosis of large intestine without perforation or abscess without bleeding: Secondary | ICD-10-CM | POA: Diagnosis not present

## 2024-10-30 ENCOUNTER — Encounter: Payer: Self-pay | Admitting: Family Medicine

## 2024-10-30 ENCOUNTER — Ambulatory Visit: Admitting: Family Medicine

## 2024-10-30 VITALS — BP 124/82 | HR 86 | Temp 98.2°F | Ht 64.0 in | Wt 203.0 lb

## 2024-10-30 DIAGNOSIS — E1165 Type 2 diabetes mellitus with hyperglycemia: Secondary | ICD-10-CM | POA: Diagnosis not present

## 2024-10-30 DIAGNOSIS — Z87891 Personal history of nicotine dependence: Secondary | ICD-10-CM

## 2024-10-30 DIAGNOSIS — E785 Hyperlipidemia, unspecified: Secondary | ICD-10-CM

## 2024-10-30 DIAGNOSIS — I10 Essential (primary) hypertension: Secondary | ICD-10-CM

## 2024-10-30 DIAGNOSIS — G4709 Other insomnia: Secondary | ICD-10-CM

## 2024-10-30 DIAGNOSIS — R413 Other amnesia: Secondary | ICD-10-CM | POA: Diagnosis not present

## 2024-10-30 DIAGNOSIS — E039 Hypothyroidism, unspecified: Secondary | ICD-10-CM

## 2024-10-30 DIAGNOSIS — M810 Age-related osteoporosis without current pathological fracture: Secondary | ICD-10-CM

## 2024-10-30 NOTE — Progress Notes (Signed)
 Patient Office Visit  Assessment & Plan:  Essential hypertension -     CBC with Differential/Platelet -     Comprehensive metabolic panel with GFR  Memory changes  Hyperlipidemia, unspecified hyperlipidemia type -     Lipid panel  Hypothyroidism, unspecified type -     TSH  Other insomnia  Type 2 diabetes mellitus with hyperglycemia, without long-term current use of insulin (HCC) -     Hemoglobin A1c -     Vitamin B12  Osteoporosis of lumbar spine -     VITAMIN D  25 Hydroxy (Vit-D Deficiency, Fractures)  Former smoker -     CT CHEST LUNG CANCER SCREENING LOW DOSE WO CONTRAST; Future   Assessment and Plan    Memory changes Ongoing memory changes for over ten years with recent worsening. Previous testing suggested mild cognitive impairment. No definitive dementia diagnosis. Discussed new medications, but she declined further evaluation or treatment. - Consider neurology follow-up if symptoms worsen.  Type 2 diabetes mellitus Managed with metformin  and dietary changes. - Continue metformin . - Encouraged dietary modifications.  Essential hypertension Blood pressure well-controlled. - Continue current antihypertensive regimen.  Hyperlipidemia Managed with lifestyle modifications and medication. - Continue current lipid-lowering therapy. - Encouraged lifestyle modifications.  Hypothyroidism  Osteoporosis of lumbar spine Severe bone density loss managed with Fosamax, calcium, and vitamin D . Tolerating Fosamax well. - Continue Fosamax once weekly. - Continue calcium and vitamin D  supplementation. - Encouraged weight-bearing exercises.  General Health Maintenance Up to date on most routine health maintenance. Recent colonoscopy showed one polyp, follow-up in five years. Mammogram scheduled for January 14th. Bone density scan last in January 2025, not covered until January 2026. Received flu shot in September. - Ordered CT scan for lung cancer screening at [facility]. - Continue routine health maintenance screenings as scheduled.          Return if symptoms worsen or fail to improve.   Subjective:    Patient ID: Dorothy Williamson, female    DOB: [birth year]  Age: 63 y.o. MRN: [id number]  Chief Complaint  Patient presents with   Medical Management of Chronic Issues    HPI Discussed the use of AI scribe software for clinical note transcription with the patient, who gave verbal consent to proceed.  History of Present Illness      History of Present Illness Dorothy Williamson is a 63 year old female who presents with concerns about memory loss and cognitive decline. patient is here for follow up on chronic medical issues  She has experienced intermittent memory loss and cognitive decline over the past ten years, with episodes of losing track of time and forgetting to pay major bills. She recalls an incident where she was unable to remember her own name during a phone call. She underwent neuropsychological testing in 2014 and 2021 due to concerns about her memory. She reports that her memory issues have worsened over the past four years.  She manages her medications monthly, but if one medication is out of order, it causes confusion and difficulty in maintaining her regimen. She has not experienced significant issues at work but acknowledges that her performance is not as it used to be. No significant stress recently, and she attributes some cognitive difficulties to aging.  She is currently taking metformin  for diabetes and has made dietary changes to reduce sugar and carbohydrate intake, tolerating metformin  well. She also takes Fosamax once a week for bone health and is compliant with calcium and vitamin D  supplementation.  She received a flu shot in September 2025.  She quit smoking twelve years ago after smoking for thirty years. She had a CT scan of her lungs in August 2015 and is due for another scan. She has a history of osteopenia.  She  recently had a colonoscopy on October 24, 2024, which showed one polyp, and she is scheduled for a follow-up in five years. She is up to date with her eye exams and has a mammogram scheduled for December 05, 2024.  Physical Exam CHEST: Lungs clear to auscultation bilaterally.  Results RADIOLOGY Brain MRI: Normal Bone Density Scan: Osteopenia (11/2023)  DIAGNOSTIC Colonoscopy: One polyp (10/24/2024)  Assessment and Plan Memory changes Ongoing memory changes for over ten years with recent worsening. Previous testing suggested mild cognitive impairment. No definitive dementia diagnosis. Discussed new medications, but she declined further evaluation or treatment. - Consider neurology follow-up if symptoms worsen.  Type 2 diabetes mellitus Managed with metformin  and dietary changes. - Continue metformin . - Encouraged dietary modifications.  Essential hypertension Blood pressure well-controlled. - Continue current antihypertensive regimen.  Hyperlipidemia Managed with lifestyle modifications and medication. - Continue current lipid-lowering therapy. - Encouraged lifestyle modifications.  Hypothyroidism- need to recheck labs today  Osteoporosis of lumbar spine Severe bone density loss managed with Fosamax, calcium, and vitamin D . Tolerating Fosamax well. - Continue Fosamax once weekly. - Continue calcium and vitamin D  supplementation. - Encouraged weight-bearing exercises.  General Health Maintenance Up to date on most routine health maintenance. Recent colonoscopy showed one polyp, follow-up in five years. Mammogram scheduled for January 14th. Bone density scan last in January 2025, not covered until January 2026. Received flu shot in September. Former smoker- Ordered CT scan for lung cancer screening at [facility]. - Continue routine health maintenance screenings as scheduled.    The 10-year ASCVD risk score (Arnett DK, et al., 2019) is: 14.3%  Past Medical History:   Diagnosis Date   Anxiety 11/22/2022   Arthritis    Chronic knee pain    Diabetes mellitus without complication (HCC)    GERD (gastroesophageal reflux disease)    Hypercholesteremia    Hypertension    Nerve pain    Sleep apnea    does not use CPAP   Thyroid  disease    Past Surgical History:  Procedure Laterality Date   ABDOMINAL HYSTERECTOMY     BREAST SURGERY     CARDIOVASCULAR STRESS TEST Bilateral 2017   normal results   CARPAL TUNNEL RELEASE     CHOLECYSTECTOMY     ENDOVENOUS ABLATION SAPHENOUS VEIN W/ LASER Right 03/20/2020   endovenous laser ablation right greater saphenous vein and stab phlebectomy 2 incisions right leg by Medford Blade MD    EXCISION METACARPAL MASS Right 04/03/2021   Procedure: EXCISION OF MASS RIGHT RING FINGER;  Surgeon: Murrell Drivers, MD;  Location: [facility];  Service: Orthopedics;  Laterality: Right;  30 MIN   JOINT REPLACEMENT     Social History   Tobacco Use   Smoking status: Former   Smokeless tobacco: Never  Vaping Use   Vaping status: Never Used  Substance Use Topics   Alcohol use: No   Drug use: No   Family History  Problem Relation Age of Onset   Hypertension Mother    Hypertension Father    Diabetes Father    Cancer Father    Colon cancer Brother    Cancer Brother    No Known Allergies  ROS    Objective:  BP 124/82   Pulse 86   Temp 98.2 F (36.8 C)   Ht 5' 4 (1.626 m)   Wt 203 lb (92.1 kg)   SpO2 99%   BMI 34.84 kg/m  BP Readings from Last 3 Encounters:  10/30/24 124/82  04/10/24 122/80  12/07/23 (!) 136/90   Wt Readings from Last 3 Encounters:  10/30/24 203 lb (92.1 kg)  04/10/24 198 lb 2 oz (89.9 kg)  12/07/23 207 lb (93.9 kg)    Physical Exam Vitals and nursing note reviewed.  Constitutional:      Appearance: Normal appearance.  HENT:     Head: Normocephalic.     Right Ear: Tympanic membrane, ear canal and external ear normal.     Left Ear: Tympanic membrane, ear canal and  external ear normal.  Eyes:     Extraocular Movements: Extraocular movements intact.     Conjunctiva/sclera: Conjunctivae normal.     Pupils: Pupils are equal, round, and reactive to light.  Cardiovascular:     Rate and Rhythm: Normal rate and regular rhythm.     Heart sounds: Normal heart sounds.  Pulmonary:     Effort: Pulmonary effort is normal.     Breath sounds: Normal breath sounds.  Musculoskeletal:     Right lower leg: No edema.     Left lower leg: No edema.  Neurological:     General: No focal deficit present.     Mental Status: She is alert and oriented to person, place, and time.  Psychiatric:        Mood and Affect: Mood normal.        Behavior: Behavior normal.        Thought Content: Thought content normal.        Judgment: Judgment normal.      No results found for any visits on 10/30/24.

## 2024-10-31 ENCOUNTER — Ambulatory Visit: Payer: Self-pay | Admitting: Family Medicine

## 2024-10-31 LAB — COMPREHENSIVE METABOLIC PANEL WITH GFR
AG Ratio: 1.7 (calc) (ref 1.0–2.5)
ALT: 11 U/L (ref 6–29)
AST: 12 U/L (ref 10–35)
Albumin: 4.4 g/dL (ref 3.6–5.1)
Alkaline phosphatase (APISO): 81 U/L (ref 37–153)
BUN: 10 mg/dL (ref 7–25)
CO2: 28 mmol/L (ref 20–32)
Calcium: 9.7 mg/dL (ref 8.6–10.4)
Chloride: 104 mmol/L (ref 98–110)
Creat: 0.84 mg/dL (ref 0.50–1.05)
Globulin: 2.6 g/dL (ref 1.9–3.7)
Glucose, Bld: 160 mg/dL — ABNORMAL HIGH (ref 65–99)
Potassium: 3.8 mmol/L (ref 3.5–5.3)
Sodium: 142 mmol/L (ref 135–146)
Total Bilirubin: 0.5 mg/dL (ref 0.2–1.2)
Total Protein: 7 g/dL (ref 6.1–8.1)
eGFR: 79 mL/min/1.73m2 (ref >=60)

## 2024-10-31 LAB — LIPID PANEL
Cholesterol: 147 mg/dL (ref <200)
HDL: 52 mg/dL (ref >=50)
LDL Cholesterol (Calc): 66 mg/dL
Non-HDL Cholesterol (Calc): 95 mg/dL (ref <130)
Total CHOL/HDL Ratio: 2.8 (calc) (ref <5.0)
Triglycerides: 229 mg/dL — ABNORMAL HIGH (ref <150)

## 2024-10-31 LAB — HEMOGLOBIN A1C
Hgb A1c MFr Bld: 7.6 % — ABNORMAL HIGH (ref <5.7)
Mean Plasma Glucose: 171 mg/dL
eAG (mmol/L): 9.5 mmol/L

## 2024-10-31 LAB — CBC WITH DIFFERENTIAL/PLATELET
Absolute Lymphocytes: 2468 {cells}/uL (ref 850–3900)
Absolute Monocytes: 398 {cells}/uL (ref 200–950)
Basophils Absolute: 41 {cells}/uL (ref 0–200)
Basophils Relative: 0.8 %
Eosinophils Absolute: 209 {cells}/uL (ref 15–500)
Eosinophils Relative: 4.1 %
HCT: 47.7 % — ABNORMAL HIGH (ref 35.9–46.0)
Hemoglobin: 15.5 g/dL (ref 11.7–15.5)
MCH: 29.6 pg (ref 27.0–33.0)
MCHC: 32.5 g/dL (ref 31.6–35.4)
MCV: 91 fL (ref 81.4–101.7)
MPV: 11 fL (ref 7.5–12.5)
Monocytes Relative: 7.8 %
Neutro Abs: 1984 {cells}/uL (ref 1500–7800)
Neutrophils Relative %: 38.9 %
Platelets: 323 Thousand/uL (ref 140–400)
RBC: 5.24 Million/uL — ABNORMAL HIGH (ref 3.80–5.10)
RDW: 13.6 % (ref 11.0–15.0)
Total Lymphocyte: 48.4 %
WBC: 5.1 Thousand/uL (ref 3.8–10.8)

## 2024-10-31 LAB — VITAMIN D 25 HYDROXY (VIT D DEFICIENCY, FRACTURES): Vit D, 25-Hydroxy: 74 ng/mL (ref 30–100)

## 2024-10-31 LAB — VITAMIN B12: Vitamin B-12: 468 pg/mL (ref 200–1100)

## 2024-10-31 LAB — TSH: TSH: 1.42 m[IU]/L (ref 0.40–4.50)

## 2024-11-28 ENCOUNTER — Other Ambulatory Visit: Payer: Self-pay | Admitting: Family Medicine

## 2024-11-28 DIAGNOSIS — I10 Essential (primary) hypertension: Secondary | ICD-10-CM

## 2024-12-05 ENCOUNTER — Other Ambulatory Visit: Payer: Self-pay | Admitting: Family Medicine

## 2024-12-05 ENCOUNTER — Telehealth: Payer: Self-pay

## 2024-12-05 NOTE — Telephone Encounter (Signed)
 Copied from CRM #[id number]. Topic: Clinical - Request for Lab/Test Order >> Dec 05, 2024  3:40 PM Tobias CROME wrote: Reason for CRM: Patient states she has spoken to [facility] and has been informed they have not received order for CT CHEST LUNG CANCER SCREENING LOW DOSE WO CONTRAST. Requesting order be sent to [facility] [address] [place], [place] [address]   Requesting a call once completed, [phone]

## 2024-12-05 NOTE — Telephone Encounter (Signed)
 Faxed to [facility].

## 2024-12-06 ENCOUNTER — Other Ambulatory Visit (HOSPITAL_COMMUNITY): Payer: Self-pay

## 2024-12-06 LAB — HM MAMMOGRAPHY

## 2024-12-08 ENCOUNTER — Other Ambulatory Visit: Payer: Self-pay

## 2024-12-08 ENCOUNTER — Emergency Department (HOSPITAL_BASED_OUTPATIENT_CLINIC_OR_DEPARTMENT_OTHER)

## 2024-12-08 ENCOUNTER — Encounter (HOSPITAL_BASED_OUTPATIENT_CLINIC_OR_DEPARTMENT_OTHER): Payer: Self-pay | Admitting: Emergency Medicine

## 2024-12-08 ENCOUNTER — Emergency Department (HOSPITAL_BASED_OUTPATIENT_CLINIC_OR_DEPARTMENT_OTHER)
Admission: EM | Admit: 2024-12-08 | Discharge: 2024-12-08 | Disposition: A | Discharge status: Home / Self Care | Attending: Emergency Medicine | Admitting: Emergency Medicine

## 2024-12-08 DIAGNOSIS — I10 Essential (primary) hypertension: Secondary | ICD-10-CM | POA: Diagnosis not present

## 2024-12-08 DIAGNOSIS — Z7984 Long term (current) use of oral hypoglycemic drugs: Secondary | ICD-10-CM | POA: Insufficient documentation

## 2024-12-08 DIAGNOSIS — M940 Chondrocostal junction syndrome [Tietze]: Secondary | ICD-10-CM | POA: Diagnosis not present

## 2024-12-08 DIAGNOSIS — Z79890 Hormone replacement therapy (postmenopausal): Secondary | ICD-10-CM | POA: Diagnosis not present

## 2024-12-08 DIAGNOSIS — E119 Type 2 diabetes mellitus without complications: Secondary | ICD-10-CM | POA: Diagnosis not present

## 2024-12-08 DIAGNOSIS — Z79899 Other long term (current) drug therapy: Secondary | ICD-10-CM | POA: Insufficient documentation

## 2024-12-08 DIAGNOSIS — E039 Hypothyroidism, unspecified: Secondary | ICD-10-CM | POA: Diagnosis not present

## 2024-12-08 DIAGNOSIS — R059 Cough, unspecified: Secondary | ICD-10-CM | POA: Diagnosis present

## 2024-12-08 LAB — BASIC METABOLIC PANEL WITH GFR
Anion gap: 10 (ref 5–15)
BUN: 12 mg/dL (ref 8–23)
CO2: 31 mmol/L (ref 22–32)
Calcium: 9.7 mg/dL (ref 8.9–10.3)
Chloride: 102 mmol/L (ref 98–111)
Creatinine, Ser: 0.87 mg/dL (ref 0.44–1.00)
GFR, Estimated: >60 mL/min
Glucose, Bld: 127 mg/dL — ABNORMAL HIGH (ref 70–99)
Potassium: 3.8 mmol/L (ref 3.5–5.1)
Sodium: 142 mmol/L (ref 135–145)

## 2024-12-08 LAB — CBC
HCT: 45.8 % (ref 36.0–46.0)
Hemoglobin: 15.1 g/dL — ABNORMAL HIGH (ref 12.0–15.0)
MCH: 30.4 pg (ref 26.0–34.0)
MCHC: 33 g/dL (ref 30.0–36.0)
MCV: 92.3 fL (ref 80.0–100.0)
Platelets: 300 K/uL (ref 150–400)
RBC: 4.96 MIL/uL (ref 3.87–5.11)
RDW: 15 % (ref 11.5–15.5)
WBC: 6 K/uL (ref 4.0–10.5)
nRBC: 0 % (ref 0.0–0.2)

## 2024-12-08 MED ORDER — IOHEXOL 350 MG/ML SOLN
75.0000 mL | Freq: Once | INTRAVENOUS | Status: AC | PRN
  Administered 2024-12-08: 75 mL via INTRAVENOUS

## 2024-12-08 NOTE — ED Triage Notes (Signed)
 Pt feels like she has a lump on LT side upper back x 4d; sts she was coughing a lot and felt like my back expanded; NAD at this time

## 2024-12-08 NOTE — ED Notes (Signed)
 Patient transported to CT

## 2024-12-08 NOTE — ED Provider Notes (Signed)
 " [place] [facility] AT [facility] Provider Note   CSN: [id number] Arrival date & time: 12/08/24  1507     Patient presents with: Back Pain   Dorothy Williamson is a 64 y.o. female.  With pertinent medical history of type 2 diabetes, hypothyroidism, GERD, hypertension, OSA, hyperlipidemia, osteoporosis of lumbar spine, and sarcoidosis.   Patient is here for evaluation of left shoulder blade discomfort.  She reports having a cough and seeing an online provider for this on 1/6.  She was prescribed steroids and albuterol inhaler at that time.  She continued to have cough and states that on about 1/12 she feels as though she coughed so hard her back expanded.  Then 4 days ago she began experiencing dull achy pain below the left shoulder blade and feels as though there is an air pocket in this area causing this discomfort.  She reports palpitations once weekly with exertion; going on to say that her Apple Watch will notify her of this elevated heart rate.  She reports shortness of breath with cough.  She denies syncope.  She does not take birth control, denies smoking, denies history of blood clots, denies recent sedentary behavior, and denies unilateral leg swelling/pain. She reports cough has improved with time.  Patient reports history of sarcoidosis.  The history is provided by the patient.  Back Pain Quality:  Aching      Prior to Admission medications  Medication Sig Start Date End Date Taking? Authorizing Provider  amLODipine  (NORVASC ) 10 MG tablet TAKE 1 TABLET BY MOUTH EVERY DAY 09/18/24   Aletha Bene, MD  Biotin 1 MG CAPS Take 1 capsule by mouth daily.    [provider]  carbidopa -levodopa  (SINEMET  IR) 25-100 MG tablet TAKE 2 TABLETS BY MOUTH AT BEDTIME 12/05/24   Duanne Butler DASEN, MD  cetirizine (ZYRTEC) 10 MG tablet Take 10 mg by mouth daily.    [provider]  cholecalciferol (VITAMIN D ) 1000 units tablet Take 1,000 Units by mouth  daily.    [provider]  fluticasone (FLONASE) 50 MCG/ACT nasal spray Place 1 spray into both nostrils daily.  08/24/18   [provider]  gabapentin  (NEURONTIN ) 300 MG capsule TAKE 1 CAPSULE BY MOUTH THREE TIMES A DAY 09/18/24   Aguiar, Rafaela, MD  hydrochlorothiazide (HYDRODIURIL) 25 MG tablet TAKE 1 TABLET (25 MG TOTAL) BY MOUTH DAILY. 11/28/24   Aletha Bene, MD  HYDROcodone -acetaminophen  [facility]) 5-325 MG tablet 1-2 tabs po q6 hours prn pain 04/03/21   Kuzma, Kevin, MD  levothyroxine  (SYNTHROID ) 100 MCG tablet Take 1 tablet (100 mcg total) by mouth daily. 04/13/24   Aguiar, Rafaela, MD  magnesium 30 MG tablet Take 30 mg by mouth daily.    [provider]  metFORMIN  (GLUCOPHAGE ) 500 MG tablet TAKE 1 TABLET (500 MG TOTAL) BY MOUTH 2 (TWO) TIMES DAILY BEFORE A MEAL. 09/18/24   Aletha Bene, MD  pantoprazole  (PROTONIX ) 20 MG tablet TAKE 1 TABLET BY MOUTH EVERY DAY 12/05/24   Duanne Butler DASEN, MD  potassium chloride (KLOR-CON) 10 MEQ tablet TAKE 1 TABLET BY MOUTH EVERY DAY 10/09/24   Aguiar, Rafaela, MD  rosuvastatin (CRESTOR) 10 MG tablet TAKE 1 TABLET BY MOUTH EVERY DAY 10/09/24   Aletha Bene, MD  zolpidem  (AMBIEN ) 10 MG tablet TAKE 1 TABLET BY MOUTH AT BEDTIME 06/13/24   Aguiar, Rafaela, MD  DULoxetine (CYMBALTA) 60 MG capsule Take 60 mg by mouth daily.  08/27/18 03/23/21  [provider]  POTASSIUM PO Take  1 tablet by mouth daily.  03/23/21  [provider]    Allergies: Patient has no known allergies.    Review of Systems  Musculoskeletal:  Positive for back pain.    Updated Vital Signs BP (!) 145/84   Pulse (!) 58   Temp 98 F (36.7 C) (Oral)   Resp 16   Ht 5' 4 (1.626 m)   Wt 93.4 kg   SpO2 99%   BMI 35.36 kg/m   Physical Exam Vitals and nursing note reviewed.  Constitutional:      General: She is not in acute distress.    Appearance: Normal appearance. She is not ill-appearing, toxic-appearing or diaphoretic.  HENT:      Head: Normocephalic and atraumatic.     Nose: Nose normal. No congestion or rhinorrhea.     Mouth/Throat:     Mouth: Mucous membranes are moist.     Pharynx: Oropharynx is clear. No oropharyngeal exudate or posterior oropharyngeal erythema.  Eyes:     General: No scleral icterus.    Extraocular Movements: Extraocular movements intact.     Conjunctiva/sclera: Conjunctivae normal.  Cardiovascular:     Rate and Rhythm: Normal rate and regular rhythm.     Pulses: Normal pulses.     Heart sounds: Normal heart sounds.  Pulmonary:     Effort: Pulmonary effort is normal. No respiratory distress.     Breath sounds: Normal breath sounds. No stridor. No wheezing, rhonchi or rales.  Abdominal:     General: Abdomen is flat. Bowel sounds are normal. There is no distension.     Palpations: Abdomen is soft.     Tenderness: There is no abdominal tenderness. There is no guarding.  Musculoskeletal:        General: Tenderness (Tenderness with palpation of infraspinatus. As well as low anterior ribs.) present. No swelling, deformity or signs of injury. Normal range of motion.     Cervical back: Normal range of motion.     Right lower leg: No edema.     Left lower leg: No edema.  Skin:    General: Skin is warm and dry.     Capillary Refill: Capillary refill takes less than 2 seconds.     Coloration: Skin is not jaundiced or pale.  Neurological:     Mental Status: She is alert and oriented to person, place, and time.     (all labs ordered are listed, but only abnormal results are displayed) Labs Reviewed  CBC - Abnormal; Notable for the following components:      Result Value   Hemoglobin 15.1 (*)    All other components within normal limits  BASIC METABOLIC PANEL WITH GFR - Abnormal; Notable for the following components:   Glucose, Bld 127 (*)    All other components within normal limits    EKG: None  Radiology: CT Angio Chest PE W and/or Wo Contrast Result Date: 12/08/2024 EXAM: CTA  CHEST 12/08/2024 07:22:44 PM TECHNIQUE: CTA of the chest was performed after the administration of intravenous contrast. Multiplanar reformatted images are provided for review. MIP images are provided for review. Automated exposure control, iterative reconstruction, and/or weight based adjustment of the mA/kV was utilized to reduce the radiation dose to as low as reasonably achievable. COMPARISON: 07/06/2023, 03/23/2017, chest x-ray from earlier in the same day. CLINICAL HISTORY: Left-sided chest pain, initial encounter. FINDINGS: PULMONARY ARTERIES: Pulmonary artery is well visualized with a normal branching pattern. No intraluminal filling defect is seen. Main pulmonary artery is normal  in caliber. MEDIASTINUM: The heart is not significantly enlarged. No significant coronary calcifications are noted. The pericardium demonstrates no acute abnormality. The aorta shows no aneurysmal dilatation. The degree of aortic opacification is limited precluding evaluation for dissection. The thoracic inlet is within normal limits. The esophagus is within normal limits. LYMPH NODES: Partially calcified mediastinal and hilar nodes are noted, similar to that seen on the prior exam. These changes are most consistent with sarcoid/prior granulomatous disease. No axillary lymphadenopathy. LUNGS AND PLEURA: The lungs are well aerated bilaterally. Stable left apical nodularity is noted dating back to 2018, consistent with scarring. Scarring is noted in the posterior right upper lobe, stable from 2018. A focal nodule is noted in the anterior aspect of the right upper lobe measures just under 4 mm. This is stable from the prior exam in 2024 as well as in 2018 and felt to be benign in etiology. No other sizable nodules are seen. No sizable effusion is seen. No pneumothorax. UPPER ABDOMEN: The visualized upper abdomen is unremarkable. The gallbladder has been surgically removed. SOFT TISSUES AND BONES: Bony structures showed degenerative  change of the thoracic spine. No acute soft tissue abnormality. IMPRESSION: 1. No evidence of pulmonary embolism. 2. Changes consistent with sarcoidosis. 3. Chronic scarring and nodular changes in the right upper lobe. No follow-up is recommended. Electronically signed by: Oneil Devonshire MD 12/08/2024 07:36 PM EST RP Workstation: [id number]   DG Chest 2 View Result Date: 12/08/2024 CLINICAL DATA:  Cough and back pain. EXAM: CHEST - 2 VIEW COMPARISON:  Chest CT 07/06/2023 FINDINGS: The cardiac silhouette, mediastinal and hilar contours are within normal limits and stable. Stable nodular scarring type changes in the right upper lobe. No acute pulmonary infiltrates or pleural effusions. No pneumothorax. The bony thorax is intact. IMPRESSION: 1. No acute cardiopulmonary findings. 2. Stable nodular scarring type changes in the right upper lobe. Electronically Signed   By: MYRTIS Stammer M.D.   On: 12/08/2024 16:56     Procedures   Medications Ordered in the ED  iohexol  (OMNIPAQUE ) 350 MG/ML injection 75 mL (75 mLs Intravenous Contrast Given 12/08/24 1907)     Patient presents to the ED for concern of left-sided back pain and cough, this involves an extensive number of treatment options, and is a complaint that carries with it a high risk of complications and morbidity.  The differential diagnosis includes costochondritis, pulmonary embolism, pneumonia, fracture, soft tissue injury, pneumothorax.   Co morbidities that complicate the patient evaluation  Sarcoidosis Hypertension Osteoporosis   Lab Tests:  I Ordered, and personally interpreted labs.  The pertinent results include: Blood work is largely unremarkable.   Imaging Studies ordered:  I ordered imaging studies including chest x-ray and CT angio PE study with and without contrast I independently visualized and interpreted imaging which showed no acute cardiopulmonary findings on x-ray.  CT angio shows no evidence of pulmonary embolism.   Imaging does show changes consistent with sarcoidosis as well as chronic scarring and nodular changes in the right upper lobe with no follow-up recommended. I agree with the radiologist interpretation   Problem List / ED Course:      Costochondritis.  Considering history of recent viral URI and cough symptoms are most likely musculoskeletal in nature.  However we are unable to exclude pulmonary embolism based on HPI and patient age.  X-ray imaging shows no evidence of fracture, pneumonia, or pneumothorax.  CT angio shows no evidence of pulmonary embolism or other acute findings. I do not feel  additional emergent work up warranted at this time. Encouraged follow up with primary care for ongoing evaluation. Return precautions discussed and patient verbalized understanding.   Reevaluation:  After the interventions noted above, I reevaluated the patient and found that they have :improved   Dispostion:  After consideration of the diagnostic results and the patients response to treatment, I feel that the patent would benefit from supportive care in the home setting with rest, heat/ice, and over-the-counter pain medication as well as close follow-up with primary care for ongoing evaluation.  Return precautions given.                              Geneva (Revised) Score: 4, Geneva Score Interpretation: Moderate Risk Group: ~20-30% incidence of pulmonary embolism from several studies PERC Score: 1, PERC Score Interpretation: If any criteria are positive, the PERC rule cannot be used to rule out PE in this patient Medical Decision Making Amount and/or Complexity of Data Reviewed Labs: ordered. Radiology: ordered.  Risk Prescription drug management.   This note was produced using [employer]. While the provider has reviewed and verified all clinical information, transcription errors may remain.    Final diagnoses:  Costochondritis    ED Discharge Orders     None           Dorothy Williamson 12/09/24 0100    Lenor Hollering, MD 12/09/24 1521  "

## 2024-12-08 NOTE — Discharge Instructions (Addendum)
 It was a pleasure meeting with you today.  As we discussed there were no acute findings seen on blood work or imaging today.  Symptoms are likely muscular in nature secondary to recent coughing and should improve with time, rest, ice/heat, and over-the-counter pain medications.  Follow-up with primary care physician in 1 week for ongoing evaluation.  Return to ED if symptoms persist or worsen or any other new concerning symptoms develop.  Please use Tylenol  or ibuprofen for pain.  You may use 600 mg ibuprofen every 6 hours or 1000 mg of Tylenol  every 6 hours.  You may choose to alternate between the 2.  This would be most effective.  Not to exceed 4 g of Tylenol  within 24 hours.  Not to exceed 3200 mg ibuprofen 24 hours.

## 2024-12-12 ENCOUNTER — Encounter: Payer: Self-pay | Admitting: Family Medicine

## 2024-12-24 ENCOUNTER — Encounter: Admitting: Family Medicine

## 2024-12-26 ENCOUNTER — Encounter: Payer: Self-pay | Admitting: Family Medicine

## 2024-12-26 ENCOUNTER — Ambulatory Visit: Admitting: Family Medicine

## 2024-12-26 VITALS — BP 130/80 | HR 92 | Ht 64.0 in | Wt 204.0 lb

## 2024-12-26 DIAGNOSIS — E890 Postprocedural hypothyroidism: Secondary | ICD-10-CM | POA: Insufficient documentation

## 2024-12-26 DIAGNOSIS — G43909 Migraine, unspecified, not intractable, without status migrainosus: Secondary | ICD-10-CM | POA: Insufficient documentation

## 2024-12-26 DIAGNOSIS — G5731 Lesion of lateral popliteal nerve, right lower limb: Secondary | ICD-10-CM

## 2024-12-26 DIAGNOSIS — E785 Hyperlipidemia, unspecified: Secondary | ICD-10-CM

## 2024-12-26 DIAGNOSIS — M810 Age-related osteoporosis without current pathological fracture: Secondary | ICD-10-CM

## 2024-12-26 DIAGNOSIS — E039 Hypothyroidism, unspecified: Secondary | ICD-10-CM

## 2024-12-26 DIAGNOSIS — E1165 Type 2 diabetes mellitus with hyperglycemia: Secondary | ICD-10-CM

## 2024-12-26 DIAGNOSIS — I10 Essential (primary) hypertension: Secondary | ICD-10-CM

## 2024-12-26 DIAGNOSIS — G4709 Other insomnia: Secondary | ICD-10-CM

## 2024-12-26 DIAGNOSIS — E7800 Pure hypercholesterolemia, unspecified: Secondary | ICD-10-CM | POA: Insufficient documentation

## 2024-12-26 DIAGNOSIS — Z0000 Encounter for general adult medical examination without abnormal findings: Secondary | ICD-10-CM

## 2024-12-26 MED ORDER — TIRZEPATIDE 2.5 MG/0.5ML ~~LOC~~ SOAJ: 2.5000 mg | SUBCUTANEOUS | 1 refills | Status: AC

## 2024-12-26 MED ORDER — LEVOTHYROXINE SODIUM 100 MCG PO TABS: 100.0000 ug | ORAL_TABLET | Freq: Every day | ORAL | 3 refills | Status: AC

## 2024-12-26 MED ORDER — AMLODIPINE BESYLATE 10 MG PO TABS: 10.0000 mg | ORAL_TABLET | Freq: Every day | ORAL | 1 refills | Status: AC

## 2024-12-26 MED ORDER — HYDROCHLOROTHIAZIDE 25 MG PO TABS: 25.0000 mg | ORAL_TABLET | Freq: Every day | ORAL | 1 refills | Status: AC

## 2024-12-26 MED ORDER — ROSUVASTATIN CALCIUM 10 MG PO TABS: 10.0000 mg | ORAL_TABLET | Freq: Every day | ORAL | 1 refills | Status: AC

## 2024-12-26 MED ORDER — ZOLPIDEM TARTRATE 10 MG PO TABS: 10.0000 mg | ORAL_TABLET | Freq: Every day | ORAL | 0 refills | Status: AC

## 2024-12-26 MED ORDER — GABAPENTIN 300 MG PO CAPS: 300.0000 mg | ORAL_CAPSULE | Freq: Three times a day (TID) | ORAL | 1 refills | Status: AC

## 2024-12-26 MED ORDER — METFORMIN HCL 500 MG PO TABS: 500.0000 mg | ORAL_TABLET | Freq: Two times a day (BID) | ORAL | 1 refills | Status: AC

## 2024-12-26 NOTE — Progress Notes (Signed)
 "  Complete physical exam  Assessment & Plan:    Routine Health Maintenance and Physical Exam Discussed health benefits of physical activity, and encouraged her to engage in regular exercise appropriate for her age and condition.  Preventative health care  Hyperlipidemia, unspecified hyperlipidemia type -     Rosuvastatin  Calcium ; Take 1 tablet (10 mg total) by mouth daily.  Dispense: 90 tablet; Refill: 1  Essential hypertension -     hydroCHLOROthiazide ; Take 1 tablet (25 mg total) by mouth daily.  Dispense: 90 tablet; Refill: 1 -     amLODIPine  Besylate; Take 1 tablet (10 mg total) by mouth daily.  Dispense: 90 tablet; Refill: 1  Hypothyroidism, unspecified type -     Levothyroxine  Sodium; Take 1 tablet (100 mcg total) by mouth daily.  Dispense: 90 tablet; Refill: 3  Other insomnia -     Zolpidem  Tartrate; Take 1 tablet (10 mg total) by mouth at bedtime.  Dispense: 30 tablet; Refill: 0  Type 2 diabetes mellitus with hyperglycemia, without long-term current use of insulin (HCC) -     Tirzepatide ; Inject 2.5 mg into the skin once a week.  Dispense: 2 mL; Refill: 1 -     metFORMIN  HCl; Take 1 tablet (500 mg total) by mouth 2 (two) times daily with a meal.  Dispense: 180 tablet; Refill: 1  Osteoporosis of lumbar spine  Neuropathy of right peroneal nerve -     Gabapentin ; Take 1 capsule (300 mg total) by mouth 3 (three) times daily.  Dispense: 270 capsule; Refill: 1    Return in about 3 months (around 03/25/2025), or if symptoms worsen or fail to improve, for hyperlipidemia, hypertension, type 2 diabetes. Recommend healthy diet i.e mediterranean/DASH diet, consistent exercise - 30 minutes 5 day per week, and gradual weight loss. Mounjaro  2.5 mg sent to the pharmacy.  Patient is aware potential negative side effects.  Return in 3 to 4 months or sooner if necessary.  If her insurance does not cover this we can try the [facility] tablets.  Refilled medications today.  Cannot do an EKG today  due to malfunction       Subjective:  Patient ID: Dorothy Williamson, female    DOB: 04/10/1961  Age: 63 y.o. MRN: [id number] Chief Complaint  Patient presents with   Annual Exam    Here for CPE.     Dorothy Williamson is a 64 y.o. female who presents today for a complete physical exam. Colonoscopy-UTD per Dr. Dierdre, needs repeat in 5 years ([facility]), had polyp last year 2025 DEXA scan UTD was discussed last time Tetanus-UTD 2022 Shingrix- not yet, declines History of Present Illness Dorothy Williamson is a 64 year old female who presents for medication management and follow-up after an ER visit for back pain. Patient is here for complete physical exam.   She experienced significant upper back pain last month, prompting an ER visit. The pain was severe and located around the left shoulder blade. An chest x-ray and CT scan chest ruled out a blood clot. The pain persisted for six days before she sought medical attention. Excedrin alleviated the pain.  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 She is under the care of a rheumatologist but has not had any flares in a long time  She previously tried Ozempic for type 2 diabetes but discontinued it due to concerns about cost and side effects, including leg pain from injections. She is considering other weight loss options  but is hesitant about the side effects and cost of medications. Patient wonders about Phentermine since her friend told her about it. Patient not sure about Mounjaro  shots for diabetes but is willing to retry this. Patient has UTD eye exam.  HTN-using antihypertensive medication without difficulty.  Denies associated signs and symptoms including chest pain, shortness of breath, cough headache, peripheral swelling cramps spasms and palpitations. Patient taking hydrochlorothiazide  and Norvasc  which seem to be working well.  Hyperlipidemia-denies unusual muscle aches or muscle cramps or difficulty  tolerating statin therapy.  Aware of need for diet control, exercise and healthy eating.  Her dietary habits are described as 'fifty-fifty' in terms of healthiness, with a tendency to eat out rather than at home. She underwent a colonoscopy recently last year and was advised to follow a high-fiber diet. She is uncertain about the recommended interval for her next colonoscopy but thinks it's 5 years per [facility] Dr. Dierdre  She has a history of a partial hysterectomy performed in 2000, with her ovaries left intact. She received a flu shot at work but declined the shingles vaccine, citing a previous adverse reaction to a pneumonia vaccine and having had shingles in the past.  She reports regular bowel movements, occurring daily. No recent falls or balance issues. Hearing is adequate, although she needs to increase the volume on her television.  Physical Exam HEENT: External auditory canals clear, no cerumen impaction.  Results Labs Renal function panel (11/2024): Within normal limits  Radiology Mammogram (11/2024): Normal Chest CT (11/2024): No pulmonary embolism; right lung scarring Chest X-ray (11/2024): Right lung scarring; compared to 2024 imaging  Diagnostic Colonoscopy: High fiber diet recommended; follow-up interval three to five years  Assessment and Plan Type 2 diabetes mellitus Discussed Mounjaro  for diabetes management and potential benefits including weight loss and reduced medication burden. Insurance coverage likely due to diabetes indication. - Prescribed Mounjaro . - Discussed potential side effects: nausea, constipation.  Essential hypertension Essential hypertension. Meds are working for her and needs refills.   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 Patient reports pain in the back or shoulder blade, described as a knot in the back, severe enough to prevent driving.  General Health  Maintenance Routine health maintenance discussed. Recent mammogram, colonoscopy, and eye exam completed. Flu shot received. Shingles vaccine declined due to previous infection. Tetanus vaccine up to date.  - Continue routine health maintenance screenings. - Encouraged high fiber diet post-colonoscopy. - Discussed balance exercises for stability. - Encouraged stretching exercises to prevent stiffness. Health Maintenance  Topic Date Due   Complete foot exam   Never done   Eye exam for diabetics  Never done   Kidney health urinalysis for diabetes  Never done   Pap with HPV screening  07/22/2020   COVID-19 Vaccine (5 - 2025-26 season) 01/11/2025*   Zoster (Shingles) Vaccine (1 of 2) 01/28/2025*   Flu Shot  02/19/2025*   Pneumococcal Vaccine for age over 54 (2 of 2 - PCV) 10/30/2025*   Hemoglobin A1C  04/30/2025   Screening for Lung Cancer  12/08/2025   Yearly kidney function blood test for diabetes  12/08/2025   Breast Cancer Screening  12/06/2026   DTaP/Tdap/Td vaccine (3 - Td or Tdap) 09/22/2031   Colon Cancer Screening  10/23/2034   Hepatitis B Vaccine  Completed   HPV Vaccine (No Doses Required) Completed   Hepatitis C Screening  Completed   HIV Screening  Completed   Meningitis B Vaccine  Aged Out  *Topic was postponed. The date shown is not the original due date.    Most recent fall risk assessment:    04/10/2024    8:38 AM  Fall Risk   Falls in the past year? 0  Number falls in past yr: 0  Injury with Fall? 0      Data saved with a previous flowsheet row definition     Most recent depression screenings:    04/10/2024    8:39 AM 12/07/2023    7:56 AM  PHQ 2/9 Scores  PHQ - 2 Score 5 1  PHQ- 9 Score 15       Data saved with a previous flowsheet row definition      The 10-year ASCVD risk score (Arnett DK, et al., 2019) is: 13.5%   Values used to calculate the score:     Age: 63 years     Clinically relevant sex: Female     Is Non-Hispanic African American:  Yes     Diabetic: Yes     Tobacco smoker: No     Systolic Blood Pressure: 130 mmHg     Is BP treated: Yes     HDL Cholesterol: 52 mg/dL     Total Cholesterol: 147 mg/dL  Past Surgical History:  Procedure Laterality Date   ABDOMINAL HYSTERECTOMY     2000   BREAST SURGERY     CARDIOVASCULAR STRESS TEST Bilateral 2017   normal results   CARPAL TUNNEL RELEASE     CHOLECYSTECTOMY     ENDOVENOUS ABLATION SAPHENOUS VEIN W/ LASER Right 03/20/2020   endovenous laser ablation right greater saphenous vein and stab phlebectomy 2 incisions right leg by Medford Blade MD    EXCISION METACARPAL MASS Right 04/03/2021   Procedure: EXCISION OF MASS RIGHT RING FINGER;  Surgeon: Murrell Drivers, MD;  Location: [facility];  Service: Orthopedics;  Laterality: Right;  30 MIN   JOINT REPLACEMENT     Social History[1] Social History   Socioeconomic History   Marital status: Widowed    Spouse name: Not on file   Number of children: Not on file   Years of education: Not on file   Highest education level: GED or equivalent  Occupational History   Not on file  Tobacco Use   Smoking status: Former   Smokeless tobacco: Never  Vaping Use   Vaping status: Never Used  Substance and Sexual Activity   Alcohol use: Yes    Alcohol/week: 1.0 standard drink of alcohol    Types: 1 Glasses of wine per week    Comment: No   Drug use: Never   Sexual activity: Never    Birth control/protection: Surgical  Other Topics Concern   Not on file  Social History Narrative   Not on file   Social Drivers of Health   Tobacco Use: Medium Risk (12/26/2024)   Patient History    Smoking Tobacco Use: Former    Smokeless Tobacco Use: Never    Passive Exposure: Not on file  Financial Resource Strain: Medium Risk (10/29/2024)   Overall Financial Resource Strain (CARDIA)    Difficulty of Paying Living Expenses: Somewhat hard  Food Insecurity: No Food Insecurity (10/29/2024)   Epic    Worried About Radiation Protection Practitioner  of Food in the Last Year: Never true    Ran Out of Food in the Last Year: Never true  Transportation Needs: No Transportation Needs (10/29/2024)   Epic    Lack of Transportation (Medical):  No    Lack of Transportation (Non-Medical): No  Physical Activity: Sufficiently Active (10/29/2024)   Exercise Vital Sign    Days of Exercise per Week: 5 days    Minutes of Exercise per Session: 150+ min  Stress: Stress Concern Present (10/29/2024)   [employer] of Occupational Health - Occupational Stress Questionnaire    Feeling of Stress: To some extent  Social Connections: Moderately Integrated (10/29/2024)   Social Connection and Isolation Panel    Frequency of Communication with Friends and Family: More than three times a week    Frequency of Social Gatherings with Friends and Family: Never    Attends Religious Services: 1 to 4 times per year    Active Member of [employer] or Organizations: No    Attends Engineer, Structural: Not on file    Marital Status: Living with partner  Intimate Partner Violence: Not on file  Depression (PHQ2-9): High Risk (04/10/2024)   Depression (PHQ2-9)    PHQ-2 Score: 15  Alcohol Screen: Low Risk (10/29/2024)   Alcohol Screen    Last Alcohol Screening Score (AUDIT): 1  Housing: Low Risk (10/29/2024)   Epic    Unable to Pay for Housing in the Last Year: No    Number of Times Moved in the Last Year: 0    Homeless in the Last Year: No  Utilities: Low Risk (10/07/2023)   Received from [facility]   Utilities    In the past 12 months has the electric, gas, oil, or water company threatened to shut off services in your home? : No  Health Literacy: Not on file   Family History  Problem Relation Age of Onset   Hypertension Mother    Hypertension Father    Diabetes Father    Cancer Father    Alcohol abuse Father    Colon cancer Brother    Cancer Brother    Allergies[2]   Patient Care Team: Aletha Bene, MD as PCP - General (Family Medicine)    Show/hide medication list[3]  ROS     Objective:    BP 130/80   Pulse 92   Ht 5' 4 (1.626 m)   Wt 204 lb (92.5 kg)   SpO2 95%   BMI 35.02 kg/m  BP Readings from Last 3 Encounters:  12/26/24 130/80  12/08/24 (!) 145/84  10/30/24 124/82   Wt Readings from Last 3 Encounters:  12/26/24 204 lb (92.5 kg)  12/08/24 206 lb (93.4 kg)  10/30/24 203 lb (92.1 kg)    Physical Exam Vitals and nursing note reviewed.  Constitutional:      General: She is not in acute distress.    Appearance: Normal appearance.  HENT:     Head: Normocephalic.     Right Ear: Tympanic membrane, ear canal and external ear normal.     Left Ear: Tympanic membrane, ear canal and external ear normal.  Eyes:     Extraocular Movements: Extraocular movements intact.     Conjunctiva/sclera: Conjunctivae normal.     Pupils: Pupils are equal, round, and reactive to light.  Cardiovascular:     Rate and Rhythm: Normal rate and regular rhythm.     Heart sounds: Normal heart sounds.  Pulmonary:     Effort: Pulmonary effort is normal.     Breath sounds: Normal breath sounds. No wheezing or rhonchi.  Chest:  Breasts:    Right: Normal. No inverted nipple, mass, nipple discharge, skin change or tenderness.     Left: Normal. No  mass, nipple discharge, skin change or tenderness.  Abdominal:     Tenderness: There is no abdominal tenderness. There is no guarding or rebound.  Musculoskeletal:     Lumbar back: No bony tenderness. Decreased range of motion.     Right lower leg: No edema.     Left lower leg: No edema.     Comments: Lower back- decreased ROM with forward flexion  Neurological:     General: No focal deficit present.     Mental Status: She is alert and oriented to person, place, and time. Mental status is at baseline.     Cranial Nerves: Cranial nerves 2-12 are intact. No cranial nerve deficit.     Motor: No weakness.     Coordination: Coordination is intact. Romberg sign negative. Coordination  normal. Finger-Nose-Finger Test normal.     Gait: Gait is intact. Gait and tandem walk normal.     Deep Tendon Reflexes: Reflexes are normal and symmetric. Reflexes normal.     Comments: Patient can do tandem gait but is very cautious  Psychiatric:        Mood and Affect: Mood normal.        Behavior: Behavior normal.        Thought Content: Thought content normal.        Judgment: Judgment normal.      No results found for any visits on 12/26/24.      Dorothy Emperor, MD     [1]  Social History Tobacco Use   Smoking status: Former   Smokeless tobacco: Never  Vaping Use   Vaping status: Never Used  Substance Use Topics   Alcohol use: Yes    Alcohol/week: 1.0 standard drink of alcohol    Types: 1 Glasses of wine per week    Comment: No   Drug use: Never  [2] No Known Allergies [3]  Outpatient Medications Prior to Visit  Medication Sig   Biotin 1 MG CAPS Take 1 capsule by mouth daily.   carbidopa -levodopa  (SINEMET  IR) 25-100 MG tablet TAKE 2 TABLETS BY MOUTH AT BEDTIME   cetirizine (ZYRTEC) 10 MG tablet Take 10 mg by mouth daily.   cholecalciferol (VITAMIN D ) 1000 units tablet Take 1,000 Units by mouth daily.   fluticasone (FLONASE) 50 MCG/ACT nasal spray Place 1 spray into both nostrils daily.    magnesium 30 MG tablet Take 30 mg by mouth daily.   pantoprazole  (PROTONIX ) 20 MG tablet TAKE 1 TABLET BY MOUTH EVERY DAY   potassium chloride (KLOR-CON) 10 MEQ tablet TAKE 1 TABLET BY MOUTH EVERY DAY   [DISCONTINUED] amLODipine  (NORVASC ) 10 MG tablet TAKE 1 TABLET BY MOUTH EVERY DAY   [DISCONTINUED] gabapentin  (NEURONTIN ) 300 MG capsule TAKE 1 CAPSULE BY MOUTH THREE TIMES A DAY   [DISCONTINUED] hydrochlorothiazide  (HYDRODIURIL ) 25 MG tablet TAKE 1 TABLET (25 MG TOTAL) BY MOUTH DAILY.   [DISCONTINUED] HYDROcodone -acetaminophen  (NORCO) 5-325 MG tablet 1-2 tabs po q6 hours prn pain   [DISCONTINUED] levothyroxine  (SYNTHROID ) 100 MCG tablet Take 1 tablet (100 mcg total) by mouth  daily.   [DISCONTINUED] metFORMIN  (GLUCOPHAGE ) 500 MG tablet TAKE 1 TABLET (500 MG TOTAL) BY MOUTH 2 (TWO) TIMES DAILY BEFORE A MEAL.   [DISCONTINUED] rosuvastatin  (CRESTOR ) 10 MG tablet TAKE 1 TABLET BY MOUTH EVERY DAY   [DISCONTINUED] zolpidem  (AMBIEN ) 10 MG tablet TAKE 1 TABLET BY MOUTH AT BEDTIME   No facility-administered medications prior to visit.   "

## 2024-12-27 ENCOUNTER — Other Ambulatory Visit (HOSPITAL_COMMUNITY): Payer: Self-pay

## 2024-12-27 ENCOUNTER — Telehealth: Payer: Self-pay | Admitting: Pharmacy Technician

## 2024-12-27 NOTE — Telephone Encounter (Signed)
 Pharmacy Patient Advocate Encounter   Received notification from [employer] CMM KEY that prior authorization for Mounjaro  2.5MG /0.5ML auto-injectors is required/requested.   Insurance verification completed.   The patient is insured through [employer].   Per test claim: PA required and submitted KEY/EOC/Request #: [id number] from 12/27/24 to 12/27/25

## 2024-12-28 ENCOUNTER — Telehealth: Payer: Self-pay

## 2024-12-28 NOTE — Telephone Encounter (Signed)
 Received a fax that stated that patient needs pre authorization for CT lung cancer screening.   Thank you!

## 2025-01-16 ENCOUNTER — Encounter: Admitting: Family Medicine

## 2025-05-06 ENCOUNTER — Ambulatory Visit: Admitting: Family Medicine

## 2025-07-17 ENCOUNTER — Ambulatory Visit: Admitting: Family Medicine

## 2025-12-19 ENCOUNTER — Other Ambulatory Visit

## 2025-12-27 ENCOUNTER — Encounter: Admitting: Family Medicine
